# Patient Record
Sex: Male | Born: 1985 | Race: White | Hispanic: No | Marital: Single | Smoking: Current some day smoker
Health system: Southern US, Community
[De-identification: ages and names within clinical notes are randomized; demographics above are authoritative.]

## PROBLEM LIST (undated history)

## (undated) DIAGNOSIS — F32A Depression, unspecified: Secondary | ICD-10-CM

## (undated) DIAGNOSIS — F419 Anxiety disorder, unspecified: Secondary | ICD-10-CM

## (undated) HISTORY — PX: LUNG SURGERY: SHX703

## (undated) HISTORY — DX: Depression, unspecified: F32.A

## (undated) HISTORY — DX: Anxiety disorder, unspecified: F41.9

---

## 2013-03-30 ENCOUNTER — Emergency Department (HOSPITAL_COMMUNITY): Payer: BC Managed Care – PPO

## 2013-03-30 ENCOUNTER — Encounter (HOSPITAL_COMMUNITY): Payer: Self-pay | Admitting: *Deleted

## 2013-03-30 ENCOUNTER — Emergency Department (HOSPITAL_COMMUNITY)
Admission: EM | Admit: 2013-03-30 | Discharge: 2013-03-30 | Disposition: A | Discharge status: Home / Self Care | Payer: BC Managed Care – PPO | Attending: Emergency Medicine | Admitting: Emergency Medicine

## 2013-03-30 DIAGNOSIS — X500XXA Overexertion from strenuous movement or load, initial encounter: Secondary | ICD-10-CM | POA: Insufficient documentation

## 2013-03-30 DIAGNOSIS — Y9301 Activity, walking, marching and hiking: Secondary | ICD-10-CM | POA: Insufficient documentation

## 2013-03-30 DIAGNOSIS — S92309A Fracture of unspecified metatarsal bone(s), unspecified foot, initial encounter for closed fracture: Secondary | ICD-10-CM | POA: Insufficient documentation

## 2013-03-30 DIAGNOSIS — Y9289 Other specified places as the place of occurrence of the external cause: Secondary | ICD-10-CM | POA: Insufficient documentation

## 2013-03-30 DIAGNOSIS — S92302A Fracture of unspecified metatarsal bone(s), left foot, initial encounter for closed fracture: Secondary | ICD-10-CM

## 2013-03-30 DIAGNOSIS — F17200 Nicotine dependence, unspecified, uncomplicated: Secondary | ICD-10-CM | POA: Insufficient documentation

## 2013-03-30 DIAGNOSIS — W108XXA Fall (on) (from) other stairs and steps, initial encounter: Secondary | ICD-10-CM | POA: Insufficient documentation

## 2013-03-30 MED ORDER — HYDROCODONE-ACETAMINOPHEN 5-325 MG PO TABS: 1.0000 | ORAL_TABLET | Freq: Four times a day (QID) | ORAL | Status: DC | PRN

## 2013-03-30 MED ORDER — HYDROCODONE-ACETAMINOPHEN 5-325 MG PO TABS
1.0000 | ORAL_TABLET | Freq: Once | ORAL | Status: AC
  Administered 2013-03-30: 1 via ORAL
  Filled 2013-03-30: qty 1

## 2013-03-30 NOTE — ED Notes (Signed)
Pt reports walking down stairs 1 hour PTA and twisting his ankle.  Denies hitting head, denies LOC.  Swelling and bruising noted to (L) lateral ankle and foot.  Pulses, sensation and movement noted.

## 2013-03-30 NOTE — ED Provider Notes (Signed)
History     CSN: [id number]  Arrival date & time 03/30/13  1947   First MD Initiated Contact with Patient 03/30/13 2016      Chief Complaint  Patient presents with  . Foot Injury    (Consider location/radiation/quality/duration/timing/severity/associated sxs/prior treatment) HPI Patient presents to the emergency department with left foot injury that occurred just prior to arrival.  Patient, states he was walking down some stairs, when he twisted his ankle and fell.  Patient, states, that he is having left lateral foot pain.  Patient denies numbness or weakness of the foot.  Patient, states, that he did not take any medications prior to arrival. History reviewed. No pertinent past medical history.  Past Surgical History  Procedure Laterality Date  . Lung surgery      History reviewed. No pertinent family history.  History  Substance Use Topics  . Smoking status: Current Every Day Smoker -- 0.50 packs/day    Types: Cigarettes  . Smokeless tobacco: Not on file  . Alcohol Use: Yes     Comment: socially      Review of Systems All other systems negative except as documented in the HPI. All pertinent positives and negatives as reviewed in the HPI. Allergies  Aspirin  Home Medications   Current Outpatient Rx  Name  Route  Sig  Dispense  Refill  . ibuprofen (ADVIL,MOTRIN) 800 MG tablet   Oral   Take 800 mg by mouth every 8 (eight) hours as needed for pain.           BP 156/94  Pulse 77  Temp(Src) 98.2 F (36.8 C) (Oral)  Resp 22  SpO2 100%  Physical Exam  Nursing note and vitals reviewed. Constitutional: He appears well-developed and well-nourished.  HENT:  Head: Normocephalic and atraumatic.  Mouth/Throat: Oropharynx is clear and moist.  Eyes: Pupils are equal, round, and reactive to light.  Cardiovascular: Normal rate, regular rhythm and normal heart sounds.  Exam reveals no gallop and no friction rub.   No murmur heard. Pulmonary/Chest: Effort normal  and breath sounds normal. No respiratory distress.  Musculoskeletal:       Left foot: He exhibits decreased range of motion, tenderness and swelling. He exhibits normal capillary refill, no crepitus, no deformity and no laceration.       Feet:    ED Course  Procedures (including critical care time)  Labs Reviewed - No data to display Dg Ankle Complete Left  03/30/2013  *RADIOLOGY REPORT*  Clinical Data: Fall down stairs.  Ankle injury and pain.  LEFT ANKLE COMPLETE - 3+ VIEW  Comparison: None.  Findings: No evidence of fracture or dislocation.  No evidence of ankle joint effusion.  No other significant bone abnormality identified.  IMPRESSION: Negative.   Original Report Authenticated By: Myles Rosenthal, M.D.    Dg Foot Complete Left  03/30/2013  *RADIOLOGY REPORT*  Clinical Data: 27 year old male status post fall down stairs. Pain.  LEFT FOOT - COMPLETE 3+ VIEW  Comparison: None.  Findings: Comminuted minimally-displaced fracture at the base of the left fifth metatarsal.  Overlying soft tissue swelling.  Calcaneus intact. Bone mineralization is within normal limits. Joint spaces and alignment within normal limits.  No other acute fracture.  IMPRESSION: Comminuted minimally-displaced fracture at the base of the left fifth metatarsal.   Original Report Authenticated By: Erskine Speed, M.D.    Patient has a fracture at the base of the fifth metatarsal.  Patient be referred to orthopedics for further evaluation and follow  is advised to ice and elevate his foot.  Told to return here for any worsening in his condition     MDM  MDM Reviewed: nursing note and vitals Interpretation: x-ray           Carlyle Dolly, PA-C 03/30/13 2237

## 2013-04-02 NOTE — ED Provider Notes (Signed)
Medical screening examination/treatment/procedure(s) were performed by non-physician practitioner and as supervising physician I was immediately available for consultation/collaboration.   Malva Diesing, MD 04/02/13 1052 

## 2015-08-10 ENCOUNTER — Emergency Department (HOSPITAL_COMMUNITY)
Admission: EM | Admit: 2015-08-10 | Discharge: 2015-08-10 | Disposition: A | Discharge status: Home / Self Care | Payer: Self-pay | Attending: Emergency Medicine | Admitting: Emergency Medicine

## 2015-08-10 ENCOUNTER — Emergency Department (HOSPITAL_COMMUNITY): Payer: Self-pay

## 2015-08-10 ENCOUNTER — Encounter (HOSPITAL_COMMUNITY): Payer: Self-pay | Admitting: Emergency Medicine

## 2015-08-10 DIAGNOSIS — R0602 Shortness of breath: Secondary | ICD-10-CM | POA: Insufficient documentation

## 2015-08-10 DIAGNOSIS — R0789 Other chest pain: Secondary | ICD-10-CM | POA: Insufficient documentation

## 2015-08-10 DIAGNOSIS — S46912A Strain of unspecified muscle, fascia and tendon at shoulder and upper arm level, left arm, initial encounter: Secondary | ICD-10-CM | POA: Insufficient documentation

## 2015-08-10 DIAGNOSIS — M62838 Other muscle spasm: Secondary | ICD-10-CM | POA: Insufficient documentation

## 2015-08-10 DIAGNOSIS — Y9289 Other specified places as the place of occurrence of the external cause: Secondary | ICD-10-CM | POA: Insufficient documentation

## 2015-08-10 DIAGNOSIS — Y998 Other external cause status: Secondary | ICD-10-CM | POA: Insufficient documentation

## 2015-08-10 DIAGNOSIS — Y9389 Activity, other specified: Secondary | ICD-10-CM | POA: Insufficient documentation

## 2015-08-10 DIAGNOSIS — R071 Chest pain on breathing: Secondary | ICD-10-CM

## 2015-08-10 DIAGNOSIS — Z720 Tobacco use: Secondary | ICD-10-CM | POA: Insufficient documentation

## 2015-08-10 DIAGNOSIS — X58XXXA Exposure to other specified factors, initial encounter: Secondary | ICD-10-CM | POA: Insufficient documentation

## 2015-08-10 LAB — CBC WITH DIFFERENTIAL/PLATELET
BASOS PCT: 0 % (ref 0–1)
Basophils Absolute: 0 10*3/uL (ref 0.0–0.1)
EOS ABS: 0.2 10*3/uL (ref 0.0–0.7)
Eosinophils Relative: 2 % (ref 0–5)
HEMATOCRIT: 43.8 % (ref 39.0–52.0)
HEMOGLOBIN: 14.9 g/dL (ref 13.0–17.0)
LYMPHS ABS: 2.5 10*3/uL (ref 0.7–4.0)
Lymphocytes Relative: 39 % (ref 12–46)
MCH: 32.4 pg (ref 26.0–34.0)
MCHC: 34 g/dL (ref 30.0–36.0)
MCV: 95.2 fL (ref 78.0–100.0)
MONOS PCT: 7 % (ref 3–12)
Monocytes Absolute: 0.5 10*3/uL (ref 0.1–1.0)
NEUTROS ABS: 3.3 10*3/uL (ref 1.7–7.7)
NEUTROS PCT: 52 % (ref 43–77)
Platelets: 207 10*3/uL (ref 150–400)
RBC: 4.6 MIL/uL (ref 4.22–5.81)
RDW: 12.5 % (ref 11.5–15.5)
WBC: 6.5 10*3/uL (ref 4.0–10.5)

## 2015-08-10 LAB — I-STAT TROPONIN, ED: TROPONIN I, POC: 0 ng/mL (ref 0.00–0.08)

## 2015-08-10 LAB — BASIC METABOLIC PANEL
Anion gap: 7 (ref 5–15)
BUN: 10 mg/dL (ref 6–20)
CHLORIDE: 103 mmol/L (ref 101–111)
CO2: 28 mmol/L (ref 22–32)
CREATININE: 0.77 mg/dL (ref 0.61–1.24)
Calcium: 9.5 mg/dL (ref 8.9–10.3)
GFR calc Af Amer: >60 mL/min (ref >60)
GFR calc non Af Amer: >60 mL/min (ref >60)
Glucose, Bld: 104 mg/dL — ABNORMAL HIGH (ref 65–99)
POTASSIUM: 3.8 mmol/L (ref 3.5–5.1)
SODIUM: 138 mmol/L (ref 135–145)

## 2015-08-10 LAB — D-DIMER, QUANTITATIVE: D-Dimer, Quant: <0.27 ug/mL-FEU (ref 0.00–0.48)

## 2015-08-10 MED ORDER — HYDROCODONE-ACETAMINOPHEN 5-325 MG PO TABS: 1.0000 | ORAL_TABLET | Freq: Four times a day (QID) | ORAL | Status: DC | PRN

## 2015-08-10 MED ORDER — MORPHINE SULFATE (PF) 4 MG/ML IV SOLN
4.0000 mg | Freq: Once | INTRAVENOUS | Status: AC
  Administered 2015-08-10: 4 mg via INTRAVENOUS
  Filled 2015-08-10: qty 1

## 2015-08-10 MED ORDER — GI COCKTAIL ~~LOC~~
30.0000 mL | Freq: Once | ORAL | Status: AC
  Administered 2015-08-10: 30 mL via ORAL
  Filled 2015-08-10: qty 30

## 2015-08-10 MED ORDER — SODIUM CHLORIDE 0.9 % IV SOLN
Freq: Once | INTRAVENOUS | Status: AC
  Administered 2015-08-10: 10:00:00 via INTRAVENOUS

## 2015-08-10 MED ORDER — NITROGLYCERIN 0.4 MG SL SUBL: 0.4000 mg | SUBLINGUAL_TABLET | SUBLINGUAL | Status: DC | PRN

## 2015-08-10 NOTE — ED Provider Notes (Signed)
CSN: [id number]     Arrival date & time 08/10/15  4742 History   First MD Initiated Contact with Patient 08/10/15 [phone]     Chief Complaint  Patient presents with  . Chest Pain     (Consider location/radiation/quality/duration/timing/severity/associated sxs/prior Treatment) HPI Comments: Ronnie Hester is a 29 y.o. male with a PMHx of spontaneous pneumothorax s/p lung surgery (unable to find prior documentation, this is per pt's reports), who presents to the ED with complaints of gradual onset chest pain that began last night after he was washing his dog in the bathtub, and he stood up, at which time his left shoulder blade had some pain. He describes pain is 6/10 constant sharp pain radiating to the center of his chest from the underside of the left shoulder blade, worse with movement of his chest or neck and deep inspiration, and mildly improved with laying on his left side, unrelieved with Tylenol. He states he had some shortness of breath yesterday but this resolved today. He denies any diaphoresis, lightheadedness, fevers, chills, cough, hemoptysis, leg swelling, recent travel/surgery/immobilization, history of DVT/PE, family history of cardiac disease, abdominal pain, nausea, vomiting, diarrhea, constipation, numbness, tingling, weakness, claudication, orthopnea, smoking, or any other medical problems. Denies any illicit drug use. States this feels somewhat like when he had a spontaneous PTX. He required multiple chest tubes and ultimately surgical resection of a portion of lung.   Patient is a 29 y.o. male presenting with chest pain. The history is provided by the patient. No language interpreter was used.  Chest Pain Pain location:  Substernal area Pain quality: sharp   Pain radiates to:  L shoulder Pain radiates to the back: yes   Pain severity:  Moderate Onset quality:  Gradual Duration:  12 hours Timing:  Constant Progression:  Unchanged Chronicity:  Recurrent Context: movement     Relieved by:  Certain positions Worsened by:  Movement and deep breathing Ineffective treatments: tylenol. Associated symptoms: shortness of breath (now resolved, some yesterday)   Associated symptoms: no abdominal pain, no claudication, no cough, no diaphoresis, no fever, no lower extremity edema, no nausea, no near-syncope, no numbness, no orthopnea, no syncope, not vomiting and no weakness   Risk factors: male sex   Risk factors: no coronary artery disease, no diabetes mellitus, no high cholesterol, no hypertension, no immobilization, no prior DVT/PE, no smoking and no surgery     History reviewed. No pertinent past medical history. Past Surgical History  Procedure Laterality Date  . Lung surgery     History reviewed. No pertinent family history. Social History  Substance Use Topics  . Smoking status: Current Every Day Smoker -- 0.50 packs/day    Types: Cigarettes  . Smokeless tobacco: None  . Alcohol Use: Yes     Comment: socially    Review of Systems  Constitutional: Negative for fever, chills and diaphoresis.  Respiratory: Positive for shortness of breath (now resolved, some yesterday). Negative for cough and wheezing.   Cardiovascular: Positive for chest pain. Negative for orthopnea, claudication, leg swelling, syncope and near-syncope.  Gastrointestinal: Negative for nausea, vomiting, abdominal pain, diarrhea and constipation.  Genitourinary: Negative for dysuria and hematuria.  Musculoskeletal: Positive for myalgias (under L scapula). Negative for arthralgias.  Skin: Negative for color change.  Allergic/Immunologic: Negative for immunocompromised state.  Neurological: Negative for weakness, light-headedness and numbness.  Psychiatric/Behavioral: Negative for confusion.   10 Systems reviewed and are negative for acute change except as noted in the HPI.    Allergies  Aspirin  Home Medications   Prior to Admission medications   Medication Sig Start Date End Date  Taking? Authorizing Provider  HYDROcodone-acetaminophen (NORCO/VICODIN) 5-325 MG per tablet Take 1 tablet by mouth every 6 (six) hours as needed for pain. 03/30/13   Charlestine Night, PA-C  ibuprofen (ADVIL,MOTRIN) 800 MG tablet Take 800 mg by mouth every 8 (eight) hours as needed for pain.    Historical Provider, MD   BP 128/64 mmHg  Pulse 55  Temp(Src) 98.7 F (37.1 C) (Oral)  Resp 20  SpO2 100% Physical Exam  Constitutional: He is oriented to person, place, and time. Vital signs are normal. He appears well-developed and well-nourished.  Non-toxic appearance. No distress.  Afebrile, nontoxic, NAD  HENT:  Head: Normocephalic and atraumatic.  Mouth/Throat: Oropharynx is clear and moist and mucous membranes are normal.  Eyes: Conjunctivae and EOM are normal. Right eye exhibits no discharge. Left eye exhibits no discharge.  Neck: Normal range of motion. Neck supple.  Cardiovascular: Normal rate, regular rhythm, normal heart sounds and intact distal pulses.  Exam reveals no gallop and no friction rub.   No murmur heard. RRR, nl s1/s2, no m/r/g, distal pulses intact, no pedal edema   Pulmonary/Chest: Effort normal and breath sounds normal. No respiratory distress. He has no decreased breath sounds. He has no wheezes. He has no rhonchi. He has no rales. He exhibits no tenderness, no crepitus, no deformity and no retraction.  Faintly audible breath sounds in all fields secondary to pt not taking full deep inspirations, but overall CTAB in all lung fields, no w/r/r, no hypoxia or increased WOB, speaking in full sentences, SpO2 100% on RA  No chest wall TTP, crepitus, deformity, or retractions.  Abdominal: Soft. Normal appearance and bowel sounds are normal. He exhibits no distension. There is no tenderness. There is no rigidity, no rebound, no guarding, no CVA tenderness, no tenderness at McBurney's point and negative Murphy's sign.  Musculoskeletal: Normal range of motion.       Left shoulder:  He exhibits tenderness and spasm. He exhibits normal range of motion, no bony tenderness, no swelling, no effusion, no crepitus, no deformity, normal pulse and normal strength.       Arms: L shoulder with FROM intact, no bony TTP, mild muscular TTP and spasms palpated under scapula, no swelling/effusion, no crepitus/deformity, negative apley scratch, neg pain with resisted int/ext rotation, neg empty can test. Strength and sensation grossly intact in all extremities, distal pulses intact.  Neg homan's bilaterally, no pedal edema.  Neurological: He is alert and oriented to person, place, and time. He has normal strength. No sensory deficit.  Skin: Skin is warm, dry and intact. No rash noted.  Psychiatric: He has a normal mood and affect.  Nursing note and vitals reviewed.   ED Course  Procedures (including critical care time) Labs Review Labs Reviewed  BASIC METABOLIC PANEL - Abnormal; Notable for the following:    Glucose, Bld 104 (*)    All other components within normal limits  D-DIMER, QUANTITATIVE (NOT AT [facility])  CBC WITH DIFFERENTIAL/PLATELET  Rosezena Sensor, ED    Imaging Review Dg Chest 2 View  08/10/2015   CLINICAL DATA:  Shortness of breath  EXAM: CHEST  2 VIEW  COMPARISON:  None.  FINDINGS: The heart size and mediastinal contours are within normal limits. Both lungs are clear. The visualized skeletal structures are unremarkable.  IMPRESSION: No active cardiopulmonary disease.   Electronically Signed   By: Lucio Edward.D.  On: 08/10/2015 10:02   I have personally reviewed and evaluated these images and lab results as part of my medical decision-making.   EKG Interpretation   Date/Time:  Monday August 10 2015 09:20:36 EDT Ventricular Rate:  59 PR Interval:  167 QRS Duration: 83 QT Interval:  390 QTC Calculation: 386 R Axis:   89 Text Interpretation:  Sinus arrhythmia No prior EKG for comparison ST  depression in aVL benign early repoloarization no prior EKg for  comparison  Confirmed by LIU MD, DANA ([id number]) on 08/10/2015 9:26:07 AM      MDM   Final diagnoses:  Atypical chest pain  Inspiratory pain  Muscle spasm of left shoulder area  Muscle strain, shoulder region, left, initial encounter    29 y.o. male here with CP x1 day, some SOB yesterday but none today. Hx of spontaneous PTX requiring chest tubes/surgical repair. Began after washing dog and then stood up. Tenderness to underside of L scapula, no chest wall tenderness anteriorly. Pt c/o pain with deep inspiration. Difficult to hear breath sounds due to pt not taking deep breaths, but they are faintly present in all fields. No tachycardia or hypoxia. Will get labs and dimer. Will get EKG and CXR. Will give morphine and GI cocktail, unable to give ASA since pt is allergic. Will order NTG but hold until after morphine/GI cocktail. Will reassess shortly.   10:20 AM Trop neg. EKG with some ST depression in aVL and ST elevations which are likely early repol, no prior EKG for comparison. Given that we are >12hrs after onset, with neg trop, unlikely to be ACS related. BMP and CBC unremarkable. Dimer neg, unlikely to be PE/dissection. CXR clear with no PTX. Pt feeling improved after narcotic and GI cocktail, likely just muscle strain. Discussed heat/motrin and will send home with norco. Discussed f/up with [facility] in 1wk for recheck and to establish care in this area. I explained the diagnosis and have given explicit precautions to return to the ER including for any other new or worsening symptoms. The patient understands and accepts the medical plan as it's been dictated and I have answered their questions. Discharge instructions concerning home care and prescriptions have been given. The patient is STABLE and is discharged to home in good condition.  BP 128/85 mmHg  Pulse 60  Temp(Src) 98.7 F (37.1 C) (Oral)  Resp 13  SpO2 100%  Meds ordered this encounter  Medications  . morphine 4 MG/ML injection 4  mg    Sig:   . gi cocktail (Maalox,Lidocaine,Donnatal)    Sig:   . 0.9 %  sodium chloride infusion    Sig:   . HYDROcodone-acetaminophen (NORCO) 5-325 MG per tablet    Sig: Take 1 tablet by mouth every 6 (six) hours as needed for severe pain.    Dispense:  6 tablet    Refill:  0    Order Specific Question:  Supervising Provider    Answer:  Eber Hong [address] [place], PA-C 08/10/15 1036  Lavera Guise, MD 08/10/15 1757

## 2015-08-10 NOTE — Discharge Instructions (Signed)
Take ibuprofen as directed for inflammation and pain with norco for breakthrough pain. Do not drive or operate machinery with pain medication use. Use heat to areas of soreness, no more than 20 minutes at a time every hour. Follow up with [place] [facility] wellness for recheck of ongoing symptoms in the next 1-2 weeks. Return to ER for emergent changing or worsening of symptoms.     Chest Wall Pain Chest wall pain is pain felt in or around the chest bones and muscles. It may take up to 6 weeks to get better. It may take longer if you are active. Chest wall pain can happen on its own. Other times, things like germs, injury, coughing, or exercise can cause the pain. HOME CARE   Avoid activities that make you tired or cause pain. Try not to use your chest, belly (abdominal), or side muscles. Do not use heavy weights.  Put ice on the sore area.  Put ice in a plastic bag.  Place a towel between your skin and the bag.  Leave the ice on for 15-20 minutes for the first 2 days.  Only take medicine as told by your doctor. GET HELP RIGHT AWAY IF:   You have more pain or are very uncomfortable.  You have a fever.  Your chest pain gets worse.  You have new problems.  You feel sick to your stomach (nauseous) or throw up (vomit).  You start to sweat or feel lightheaded.  You have a cough with mucus (phlegm).  You cough up blood. MAKE SURE YOU:   Understand these instructions.  Will watch your condition.  Will get help right away if you are not doing well or get worse. Document Released: 05/23/2008 Document Revised: 02/27/2012 Document Reviewed: 08/01/2011 [facility] Patient Information 2015 [place], [place]. This information is not intended to replace advice given to you by your health care provider. Make sure you discuss any questions you have with your health care provider.  Muscle Strain A muscle strain (pulled muscle) happens when a muscle is stretched beyond normal length. It happens  when a sudden, violent force stretches your muscle too far. Usually, a few of the fibers in your muscle are torn. Muscle strain is common in athletes. Recovery usually takes 1-2 weeks. Complete healing takes 5-6 weeks.  HOME CARE   Follow the PRICE method of treatment to help your injury get better. Do this the first 2-3 days after the injury:  Protect. Protect the muscle to keep it from getting injured again.  Rest. Limit your activity and rest the injured body part.  Ice. Put ice in a plastic bag. Place a towel between your skin and the bag. Then, apply the ice and leave it on from 15-20 minutes each hour. After the third day, switch to moist heat packs.  Compression. Use a splint or elastic bandage on the injured area for comfort. Do not put it on too tightly.  Elevate. Keep the injured body part above the level of your heart.  Only take medicine as told by your doctor.  Warm up before doing exercise to prevent future muscle strains. GET HELP IF:   You have more pain or puffiness (swelling) in the injured area.  You feel numbness, tingling, or notice a loss of strength in the injured area. MAKE SURE YOU:   Understand these instructions.  Will watch your condition.  Will get help right away if you are not doing well or get worse. Document Released: 09/13/2008 Document Revised:  09/25/2013 Document Reviewed: 07/04/2013 [employer] Patient Information 2015 [place], [place]. This information is not intended to replace advice given to you by your health care provider. Make sure you discuss any questions you have with your health care provider.  Musculoskeletal Pain Musculoskeletal pain is muscle and boney aches and pains. These pains can occur in any part of the body. Your caregiver may treat you without knowing the cause of the pain. They may treat you if blood or urine tests, X-rays, and other tests were normal.  CAUSES There is often not a definite cause or reason for these pains.  These pains may be caused by a type of germ (virus). The discomfort may also come from overuse. Overuse includes working out too hard when your body is not fit. Boney aches also come from weather changes. Bone is sensitive to atmospheric pressure changes. HOME CARE INSTRUCTIONS   Ask when your test results will be ready. Make sure you get your test results.  Only take over-the-counter or prescription medicines for pain, discomfort, or fever as directed by your caregiver. If you were given medications for your condition, do not drive, operate machinery or power tools, or sign legal documents for 24 hours. Do not drink alcohol. Do not take sleeping pills or other medications that may interfere with treatment.  Continue all activities unless the activities cause more pain. When the pain lessens, slowly resume normal activities. Gradually increase the intensity and duration of the activities or exercise.  During periods of severe pain, bed rest may be helpful. Lay or sit in any position that is comfortable.  Putting ice on the injured area.  Put ice in a bag.  Place a towel between your skin and the bag.  Leave the ice on for 15 to 20 minutes, 3 to 4 times a day.  Follow up with your caregiver for continued problems and no reason can be found for the pain. If the pain becomes worse or does not go away, it may be necessary to repeat tests or do additional testing. Your caregiver may need to look further for a possible cause. SEEK IMMEDIATE MEDICAL CARE IF:  You have pain that is getting worse and is not relieved by medications.  You develop chest pain that is associated with shortness or breath, sweating, feeling sick to your stomach (nauseous), or throw up (vomit).  Your pain becomes localized to the abdomen.  You develop any new symptoms that seem different or that concern you. MAKE SURE YOU:   Understand these instructions.  Will watch your condition.  Will get help right away if you  are not doing well or get worse. Document Released: 12/05/2005 Document Revised: 02/27/2012 Document Reviewed: 08/09/2013 [facility] Patient Information 2015 [place], [place]. This information is not intended to replace advice given to you by your health care provider. Make sure you discuss any questions you have with your health care provider.  Muscle Cramps and Spasms Muscle cramps and spasms are when muscles tighten by themselves. They usually get better within minutes. Muscle cramps are painful. They are usually stronger and last longer than muscle spasms. Muscle spasms may or may not be painful. They can last a few seconds or much longer. HOME CARE  Drink enough fluid to keep your pee (urine) clear or pale yellow.  Massage, stretch, and relax the muscle.  Use a warm towel, heating pad, or warm shower water on tight muscles.  Place ice on the muscle if it is tender or in pain.  Put ice in a plastic bag.  Place a towel between your skin and the bag.  Leave the ice on for 15-20 minutes, 03-04 times a day.  Only take medicine as told by your doctor. GET HELP RIGHT AWAY IF:  Your cramps or spasms get worse, happen more often, or do not get better with time. MAKE SURE YOU:  Understand these instructions.  Will watch your condition.  Will get help right away if you are not doing well or get worse. Document Released: 11/17/2008 Document Revised: 04/01/2013 Document Reviewed: 11/21/2012 [facility] Patient Information 2015 [place], [place]. This information is not intended to replace advice given to you by your health care provider. Make sure you discuss any questions you have with your health care provider.  Heat Therapy Heat therapy can help ease sore, stiff, injured, and tight muscles and joints. Heat relaxes your muscles, which may help ease your pain.  RISKS AND COMPLICATIONS If you have any of the following conditions, do not use heat therapy unless your health care provider has  approved:  Poor circulation.  Healing wounds or scarred skin in the area being treated.  Diabetes, heart disease, or high blood pressure.  Not being able to feel (numbness) the area being treated.  Unusual swelling of the area being treated.  Active infections.  Blood clots.  Cancer.  Inability to communicate pain. This may include young children and people who have problems with their brain function (dementia).  Pregnancy. Heat therapy should only be used on old, pre-existing, or long-lasting (chronic) injuries. Do not use heat therapy on new injuries unless directed by your health care provider. HOW TO USE HEAT THERAPY There are several different kinds of heat therapy, including:  Moist heat pack.  Warm water bath.  Hot water bottle.  Electric heating pad.  Heated gel pack.  Heated wrap.  Electric heating pad. Use the heat therapy method suggested by your health care provider. Follow your health care provider's instructions on when and how to use heat therapy. GENERAL HEAT THERAPY RECOMMENDATIONS  Do not sleep while using heat therapy. Only use heat therapy while you are awake.  Your skin may turn pink while using heat therapy. Do not use heat therapy if your skin turns red.  Do not use heat therapy if you have new pain.  High heat or long exposure to heat can cause burns. Be careful when using heat therapy to avoid burning your skin.  Do not use heat therapy on areas of your skin that are already irritated, such as with a rash or sunburn. SEEK MEDICAL CARE IF:  You have blisters, redness, swelling, or numbness.  You have new pain.  Your pain is worse. MAKE SURE YOU:  Understand these instructions.  Will watch your condition.  Will get help right away if you are not doing well or get worse. Document Released: 02/27/2012 Document Revised: 04/21/2014 Document Reviewed: 01/28/2014 [facility] Patient Information 2015 [place], [place]. This information is  not intended to replace advice given to you by your health care provider. Make sure you discuss any questions you have with your health care provider.

## 2015-08-10 NOTE — ED Notes (Addendum)
Pt reports he feels like he has collapsed lung- hx of them R side and chest tubes. Was washing dog and hunched over for long time and pain on left side started setting in. States feels like it was with it last time; feels short of breath and cannot take full inspiration. RN unable to hear air movement on either side due to lack of ability to take full inspiration. Able to talk and looks stable.

## 2015-08-17 ENCOUNTER — Ambulatory Visit: Payer: Self-pay | Admitting: Family Medicine

## 2016-04-01 IMAGING — DX DG CHEST 2V
2 series · 2 of 2 positions shown · non-contrast
Comparison: None.

CLINICAL DATA: Shortness of breath

EXAM:
CHEST  2 VIEW

[chest lat]
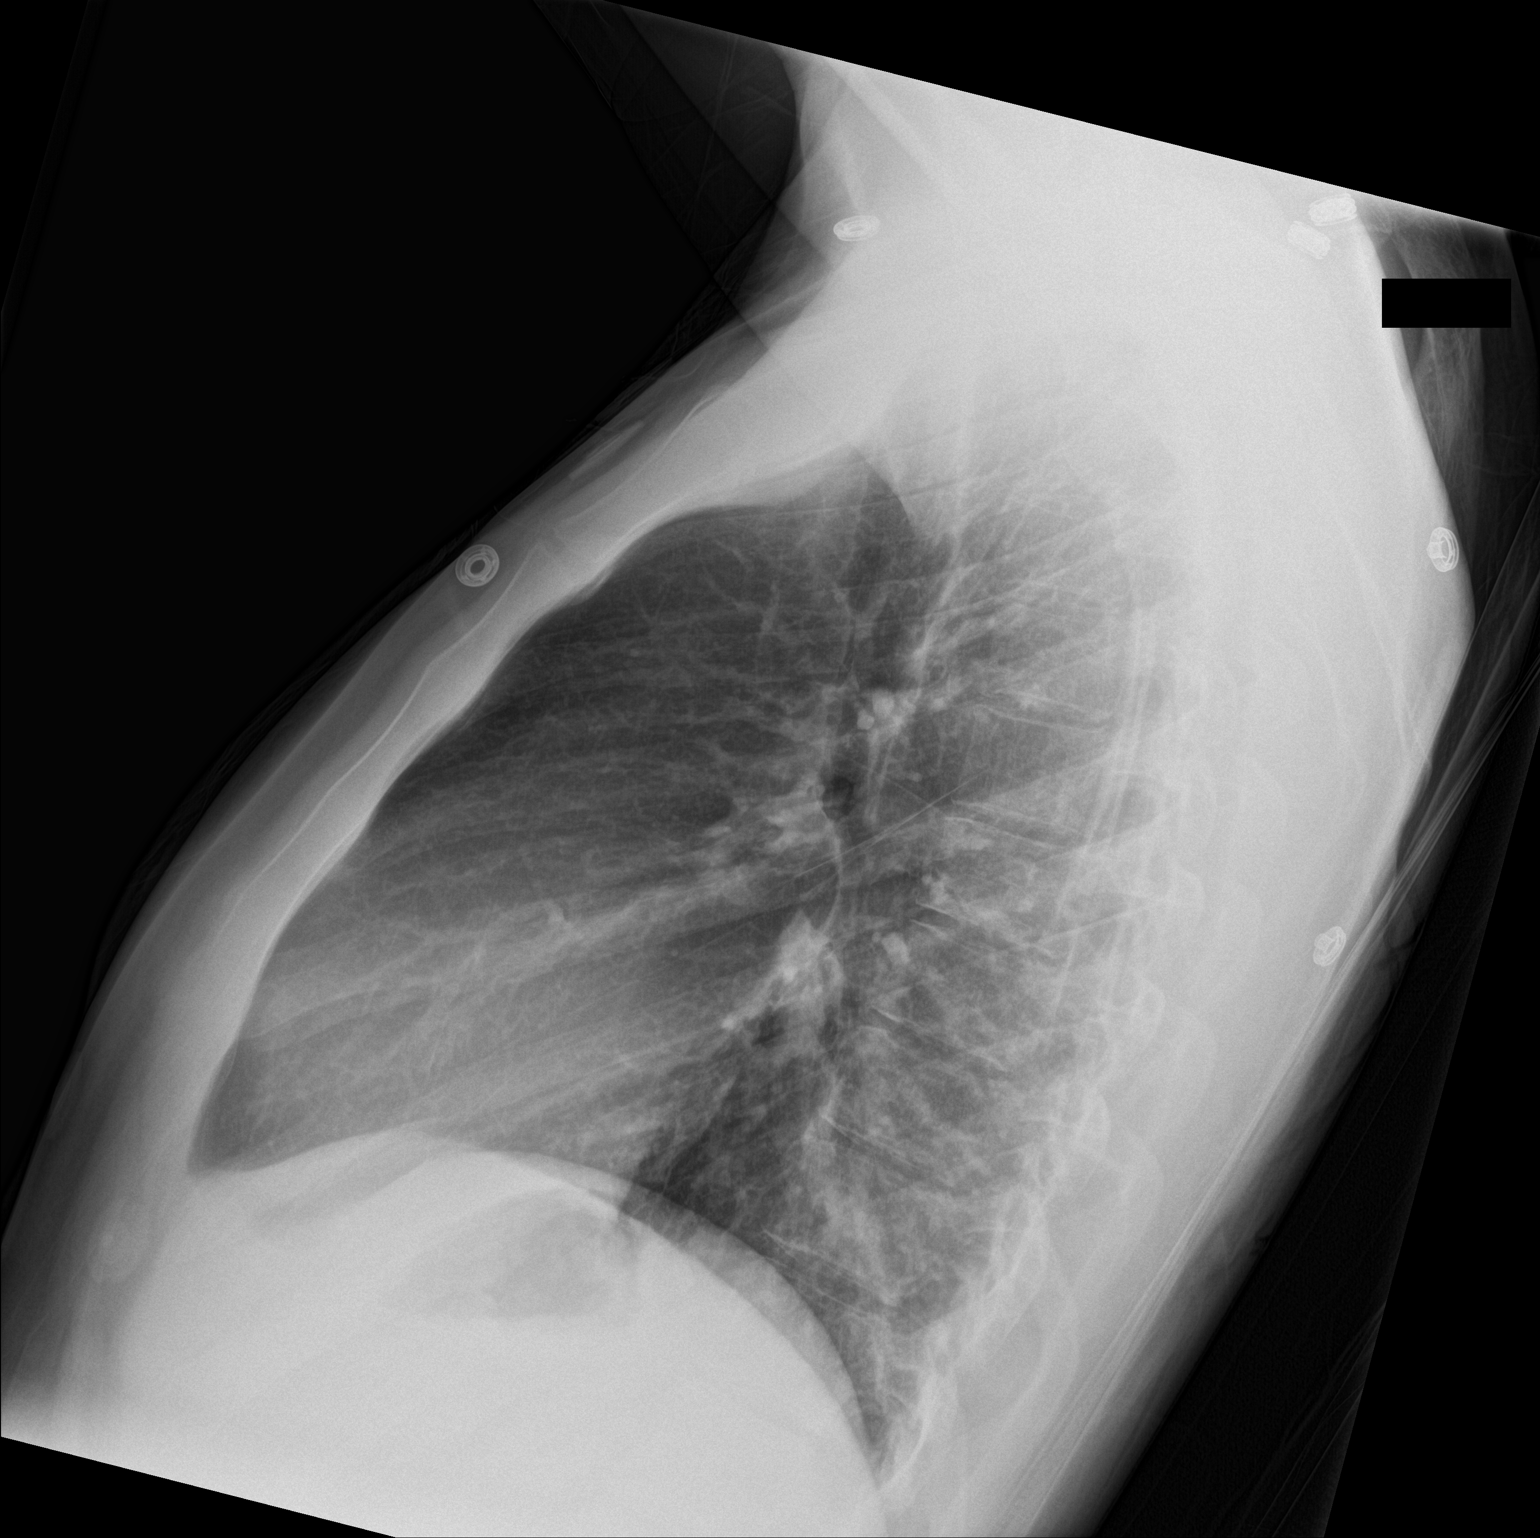

[chest ap]
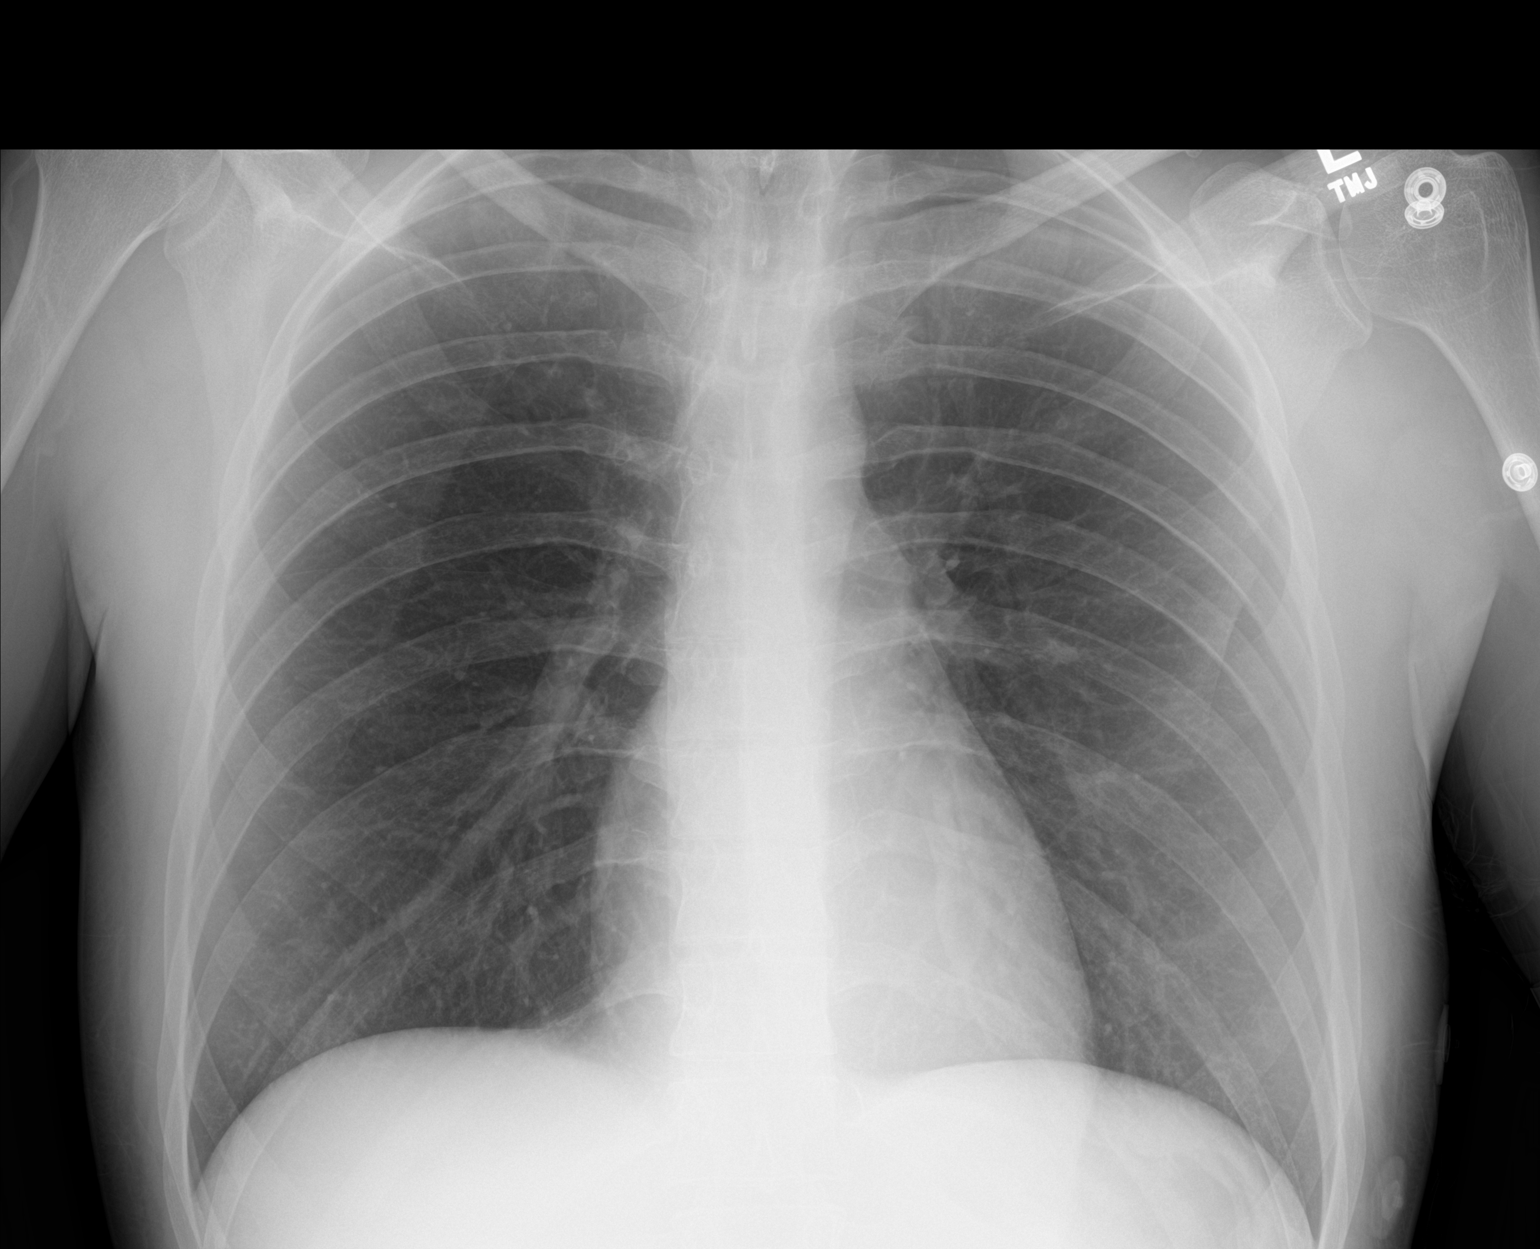

[2 of 2 positions shown; findings below may reference images not displayed]

FINDINGS: The heart size and mediastinal contours are within normal limits.
Both lungs are clear. The visualized skeletal structures are
unremarkable.
IMPRESSION: No active cardiopulmonary disease.

## 2019-09-27 ENCOUNTER — Ambulatory Visit (HOSPITAL_COMMUNITY)
Admission: EM | Admit: 2019-09-27 | Discharge: 2019-09-27 | Disposition: A | Discharge status: Home / Self Care | Payer: Self-pay

## 2019-09-27 ENCOUNTER — Other Ambulatory Visit: Payer: Self-pay

## 2019-09-27 ENCOUNTER — Encounter (HOSPITAL_COMMUNITY): Payer: Self-pay

## 2019-09-27 DIAGNOSIS — R21 Rash and other nonspecific skin eruption: Secondary | ICD-10-CM

## 2019-09-27 NOTE — ED Notes (Signed)
Patient verbalizes understanding of discharge instructions. Opportunity for questioning and answers were provided. Patient discharged from [facility] by Jenny Reichmann, [place].

## 2019-09-27 NOTE — ED Provider Notes (Addendum)
[facility]    CSN: [id number] Arrival date & time: 09/27/19  1531      History   Chief Complaint Chief Complaint  Patient presents with  . Tick Bite    HPI Ronnie Hester is a 33 y.o. male.   Patient here concerned with tick bite under chin x months.  He was seen at [facility] for same thing, provided bacitracin and triamcinolone, which he states "was working, but then I stopped for a day and it got worse and hasn't improved since".  He is here to have spot excised from his face.  Patient is upset because he states he called the clinic and he was told we could excise the spot, however, when he was brought back into the room he was then informed that we do not perform that kind of procedure here and he will need to follow up with dermatology.  He came to our clinic today specifically to have the lesion removed from under his chin.     History reviewed. No pertinent past medical history.  There are no active problems to display for this patient.   Past Surgical History:  Procedure Laterality Date  . LUNG SURGERY         Home Medications    Prior to Admission medications   Medication Sig Start Date End Date Taking? Authorizing Provider  HYDROcodone-acetaminophen (NORCO) 5-325 MG per tablet Take 1 tablet by mouth every 6 (six) hours as needed for severe pain. 08/10/15   Street, [place], PA-C  HYDROcodone-acetaminophen (NORCO/VICODIN) 5-325 MG per tablet Take 1 tablet by mouth every 6 (six) hours as needed for pain. Patient not taking: Reported on 08/10/2015 03/30/13   Charlestine Night, PA-C  ibuprofen (ADVIL,MOTRIN) 800 MG tablet Take 800 mg by mouth every 8 (eight) hours as needed for pain.    [provider]    Family History Family History  Family history unknown: Yes    Social History Social History   Tobacco Use  . Smoking status: Current Every Day Smoker    Packs/day: 0.50    Types: Cigarettes  Substance Use Topics  . Alcohol use: Yes     Comment: socially  . Drug use: No     Allergies   Aspirin and Tramadol   Review of Systems Review of Systems  Constitutional: Negative for chills, fatigue and fever.  Gastrointestinal: Negative for abdominal pain, nausea and vomiting.  Skin: Positive for rash. Negative for wound.       +itching  Psychiatric/Behavioral: Negative for sleep disturbance.     Physical Exam Triage Vital Signs ED Triage Vitals  Enc Vitals Group     BP 09/27/19 1601 126/74     Pulse Rate 09/27/19 1601 68     Resp 09/27/19 1601 16     Temp 09/27/19 1601 98.2 F (36.8 C)     Temp Source 09/27/19 1601 Temporal     SpO2 09/27/19 1601 99 %     Weight --      Height --      Head Circumference --      Peak Flow --      Pain Score 09/27/19 1610 0     Pain Loc --      Pain Edu? --      Excl. in GC? --    No data found.  Updated Vital Signs BP 126/74 (BP Location: Left Arm)   Pulse 68   Temp 98.2 F (36.8 C) (Temporal)   Resp 16  SpO2 99%   Visual Acuity Right Eye Distance:   Left Eye Distance:   Bilateral Distance:    Right Eye Near:   Left Eye Near:    Bilateral Near:     Physical Exam Vitals signs and nursing note reviewed.  Constitutional:      General: He is not in acute distress.    Appearance: Normal appearance. He is not ill-appearing.  HENT:     Head: Normocephalic and atraumatic.  Eyes:     General: No scleral icterus.    Extraocular Movements: Extraocular movements intact.     Conjunctiva/sclera: Conjunctivae normal.     Pupils: Pupils are equal, round, and reactive to light.  Neck:     Musculoskeletal: Normal range of motion and neck supple. No neck rigidity or muscular tenderness.  Pulmonary:     Effort: Pulmonary effort is normal. No respiratory distress.  Musculoskeletal: Normal range of motion.        General: No deformity.  Skin:    Capillary Refill: Capillary refill takes less than 2 seconds.     Findings: Lesion (2 cm nodule under chin, non-tender, no  erythema, no discharge, scan hair growth in lesion) present.  Neurological:     General: No focal deficit present.     Mental Status: He is alert and oriented to person, place, and time.  Psychiatric:        Attention and Perception: Attention normal.        Mood and Affect: Affect normal.        Behavior: Behavior is agitated. Behavior is cooperative.      UC Treatments / Results  Labs (all labs ordered are listed, but only abnormal results are displayed) Labs Reviewed - No data to display  EKG   Radiology No results found.  Procedures Procedures (including critical care time)  Medications Ordered in UC Medications - No data to display  Initial Impression / Assessment and Plan / UC Course  I have reviewed the triage vital signs and the nursing notes.  Pertinent labs & imaging results that were available during my care of the patient were reviewed by me and considered in my medical decision making (see chart for details).     Patient was advised we cannot excise the lesion here, and that he needs to go to dermatology. Patient became upset and requested to be refunded.  I advised him that he would need to contact billing, as I cannot authorize a refund. Final Clinical Impressions(s) / UC Diagnoses   Final diagnoses:  Rash and nonspecific skin eruption     Discharge Instructions     Follow up with dermatology. Use creams as directed.  Can also call [facility] at [phone] to schedule an appointment.    ED Prescriptions    None     PDMP not reviewed this encounter.   Peri Jefferson, PA-C 09/27/19 1656    Peri Jefferson, PA-C 09/27/19 1659

## 2019-09-27 NOTE — Discharge Instructions (Signed)
Follow up with dermatology. Use creams as directed.  Can also call [facility] at [phone] to schedule an appointment.

## 2019-09-27 NOTE — ED Triage Notes (Signed)
Pt presents with old tick bite on chin that now has severe intermittent itching that is relieved temporarily with steriods and creams.

## 2021-01-26 ENCOUNTER — Telehealth: Payer: Self-pay | Admitting: *Deleted

## 2021-01-26 DIAGNOSIS — Z20822 Contact with and (suspected) exposure to covid-19: Secondary | ICD-10-CM

## 2021-01-26 NOTE — Telephone Encounter (Signed)
RN notified by HR that pt left work this morning after being notified that roommate tested positive for Covid yesterday. Roommate began isolating when sx started Sunday 01/24/21. Pt's last exposure to him was 01/24/21.  Pt is fully vaxxed, however reported HA and body aches that started this morning at work.   Day 0 01/26/21 Day 5 01/31/21 RTW 02/01/21 with strict mask use thru Day 10 2/18.   Pt completed rapid testing after he left work today. This was negative. Advised pt too early to test since sx just started. Set up PCR testing for Day 3 01/29/21 at 10:30 at [facility].   Pt taking Tylenol, Nyquil at home. Advised do not mix tylenol and Nyquil due to double dosing. Use ibuprofen instead if needed. Pt verbalizes understanding.   Denies further questions/concerns.

## 2021-01-27 ENCOUNTER — Encounter: Payer: Self-pay | Admitting: *Deleted

## 2021-01-27 DIAGNOSIS — J939 Pneumothorax, unspecified: Secondary | ICD-10-CM | POA: Insufficient documentation

## 2021-01-27 NOTE — Telephone Encounter (Signed)
Reviewed RN Rolly Salter note patient notified of covid exposure and symptomatic 01/26/21.  Agree with plan of care.

## 2021-01-27 NOTE — Telephone Encounter (Signed)
Patient contacted by telephone stated yesterday chills and fever were worse than today alternated 500mg  tylenol and motrin OTC yesterday.  Today just had 2 doses of tylenol 500mg  po.  Appetite decreased and ate chicken today some nausea.   I have recommended clear fluids and bland diet.  Avoid dairy/spicy, fried and large portions of meat while having nausea.  If vomiting hold po intake x 1 hour.  Then sips clear fluids like broths, ginger ale, power ade, gatorade, pedialyte may advance to soft/bland if no vomiting x 24 hours and appetite returned otherwise hydration main focus.Contact clinic staff if symptoms worsen; I have alerted the patient to call if high fever, dehydration, marked weakness, fainting, increased abdominal pain, blood in stool or vomit (red or black).  Patient stated after taking with roommate his symptoms yesterday similar to roommate symptoms prior to positive covid test.  Discussed with patient fever can be dehydrating and ensure he tries to drink a cup of fluid noncaffeinated per hour when awake.  Does not need to force solid po intake when having nausea.  Discussed with patient typically if going to turn positive after exposure from omicron variant research has shown occurs by day 6 after exposure.  Most accurate testing day 3-5 of symptoms to avoid false negative.   Patient verbalized agreement and understanding of treatment plan and had no further questions at this time.

## 2021-01-29 ENCOUNTER — Other Ambulatory Visit: Payer: Self-pay

## 2021-01-29 DIAGNOSIS — Z20822 Contact with and (suspected) exposure to covid-19: Secondary | ICD-10-CM

## 2021-01-30 LAB — NOVEL CORONAVIRUS, NAA: SARS-CoV-2, NAA: DETECTED — AB

## 2021-01-30 LAB — SARS-COV-2, NAA 2 DAY TAT

## 2021-02-01 NOTE — Telephone Encounter (Signed)
Pt in to clinic now. Did RTW today as expected. Was aware of positive test result. Also knows strict mask use required thru 2/18. Denies any current sx, questions or concerns. Closing encounter.

## 2021-02-01 NOTE — Telephone Encounter (Signed)
Called pt to discuss positive result, current sx, and RTW. No answer. LVMRCB.

## 2021-02-03 NOTE — Telephone Encounter (Signed)
Reviewed RN Rolly Salter note patient notified positive covid test and strict mask use through 02/05/21

## 2021-04-13 ENCOUNTER — Telehealth: Payer: Self-pay | Admitting: *Deleted

## 2021-04-13 ENCOUNTER — Encounter: Payer: Self-pay | Admitting: *Deleted

## 2021-04-13 NOTE — Telephone Encounter (Signed)
RN spoke with pt yesterday 4/26 at supervisors' request. Pt reported a slightly itchy throat on Sunday that on Monday was more scratchy and sore. Pt reported Hx of seasonal allergies which he was not routinely treating. Had been taking Zyrtec intermittently over past few months only. Pt also was Covid positive 01/29/21. So per current CDC guidelines, pt did not need to quarantine or test due to sx. Advised pt of the same and reviewed home management of allergy sx with consistent Zyrtec use, Flonase, nasal saline. Pt verbalized understanding and agreement at that time.  Supervisor forwarded voicemail from pt to RN this morning. Pt reported in voicemail that throat was more sore today and he didn't feel comfortable reporting to work without going for Covid testing so he was planning on setting that up today and asked for supervisor to have RN contact pt.   RN spoke with pt watery eyes, rhinorrhea and PND. Is "not a fan of nasal sprays" and has only continued his Zyrtec. Has a rapid test scheduled for today at 1245. Gave pt RN phone extension to call back with results so clearance could be given to RTW.

## 2021-04-13 NOTE — Telephone Encounter (Signed)
Reviewed RN Haley note agreed with plan of care. 

## 2021-04-15 NOTE — Telephone Encounter (Signed)
Confirmed pt did RTW yesterday 4/27 as expected. Closing encounter.

## 2021-04-15 NOTE — Telephone Encounter (Signed)
Late entry: Pt called RN back on Tuesday afternoon and reported negative rapid test results. Cleared pt to RTW Wednesday 4/27 but to speak with supervisor if he wanted to take additional time off due to sx as he was not comfortable working with active sx. Will f/u Thursday.

## 2021-04-15 NOTE — Telephone Encounter (Signed)
Reviewed RN Rolly Salter notes; covid test negative and patient has returned to work onsite.

## 2021-05-25 ENCOUNTER — Telehealth: Payer: Self-pay | Admitting: *Deleted

## 2021-05-25 ENCOUNTER — Encounter: Payer: Self-pay | Admitting: *Deleted

## 2021-05-25 DIAGNOSIS — J00 Acute nasopharyngitis [common cold]: Secondary | ICD-10-CM

## 2021-05-25 DIAGNOSIS — R519 Headache, unspecified: Secondary | ICD-10-CM

## 2021-05-25 NOTE — Telephone Encounter (Signed)
Pt in to clinic c/o headache. Reports pressure behind eyes and in forehead, then straight thru back of his head. Reports mild runny nose that he attributes to allergies. Sts HA started last night. He took Tylenol and went to sleep. HA continued this morning. Reports the duration and intensity of HA are not typical of his HAs.    Day 0 6/6 Day 5 6/11 RTW 6/12 or next scheduled workday of 6/13 with strict mask use thru 6/16.   Pt scheduled for testing Day 3, 6/9 at [facility] [address].  Has not taken any meds today. Provided Tylenol and Ibuprofen from clinic OTC stock. 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

## 2021-05-25 NOTE — Telephone Encounter (Signed)
Reviewed RN Haley note agreed with plan of care. 

## 2021-05-27 NOTE — Telephone Encounter (Signed)
Noted RN Rolly Salter unable to reach patient via telephone today will try again tomorrow.

## 2021-05-27 NOTE — Telephone Encounter (Signed)
Attempted to contact pt to check on sx and ensure testing was completed today. No answer. LVMRCB.

## 2021-05-28 NOTE — Telephone Encounter (Signed)
Spoke with pt by phone. HA resolved 6/8. Still awaiting results from testing yesterday. Denies any needs or concerns. Weekend follow up.

## 2021-05-28 NOTE — Telephone Encounter (Signed)
Reviewed RN Rolly Salter note; symptoms resolved but covid test results still pending.  Will follow up with patient this weekend.

## 2021-05-29 NOTE — Telephone Encounter (Signed)
Patient contacted via telephone covid test results negative still having allergy symptoms but headache resolved.  Patient does not like taking allergy medication.  Discussed nasal saline available in clinic he can pick up bottle 2 sprays each nostril q2h wa prn congestion/rhinitis.  Washes out dust and pollen from nose/sinuses.  Flonase nasal 1 spray each nostril BID also typically nondrowsy can buy OTC.  Patient cleared to return to work on Monday 05/31/21.  HR notified.  Patient A&Ox3 spoke full sentences without difficulty, no cough/congestion/throat clearing/nasal sniffing noted during 2 minute telephone call

## 2021-05-31 NOTE — Telephone Encounter (Signed)
Reviewed RN Rolly Salter note and agreed with plan of care; patient returned to work as expected with strict mask use.

## 2021-05-31 NOTE — Telephone Encounter (Signed)
Supervisor confirmed pt did RTW today 6/13 as expected. Strict mask use thru 6/16.

## 2021-06-10 NOTE — Telephone Encounter (Signed)
Telephone message left for patient follow up to see if allergy symptoms resolving/controlled with recommendations

## 2021-07-04 NOTE — Telephone Encounter (Signed)
Spoke with patient via telephone stated still having allergy symptoms and he does not want to take any antihistamines on regular basis or nose sprays.  He has been dealing with these symptoms his whole life doesn't have sinus infection or feel like there is any problem with current symptoms.  Does not have any further questions or concerns or new symptoms.  Patient A&Ox3 spoke full sentences without difficulty no cough/nasal sniffing or throat clearing noted during 2 minute telephone call.

## 2022-10-24 ENCOUNTER — Telehealth: Payer: Self-pay | Admitting: Registered Nurse

## 2022-10-24 ENCOUNTER — Encounter: Payer: Self-pay | Admitting: Registered Nurse

## 2022-10-24 NOTE — Telephone Encounter (Signed)
Patient was seen by provider given Rx for eye medication and work note x 48 hours today and tomorrow.

## 2023-01-02 ENCOUNTER — Encounter: Payer: Self-pay | Admitting: Registered Nurse

## 2023-01-02 ENCOUNTER — Ambulatory Visit: Payer: Self-pay | Admitting: Occupational Medicine

## 2023-01-02 ENCOUNTER — Telehealth: Payer: Self-pay | Admitting: Registered Nurse

## 2023-01-02 VITALS — Temp 99.5°F

## 2023-01-02 DIAGNOSIS — S61452A Open bite of left hand, initial encounter: Secondary | ICD-10-CM

## 2023-01-02 MED ORDER — ACETAMINOPHEN 500 MG PO TABS: 1000.0000 mg | ORAL_TABLET | Freq: Four times a day (QID) | ORAL | 0 refills | Status: AC | PRN

## 2023-01-02 MED ORDER — TRIPLE ANTIBIOTIC 5-400-5000 EX OINT: TOPICAL_OINTMENT | Freq: Two times a day (BID) | CUTANEOUS | 0 refills | Status: DC

## 2023-01-02 MED ORDER — AMOXICILLIN-POT CLAVULANATE 875-125 MG PO TABS: 1.0000 | ORAL_TABLET | Freq: Two times a day (BID) | ORAL | 0 refills | Status: AC

## 2023-01-02 NOTE — Telephone Encounter (Signed)
Contacted by RN Evlyn Kanner via telephone after patient presented to clinic for dog bite left hand occurred 12/31/22. Swelling, redness and some tingling in hands noted by [employer]  Capillary refill less than 2 seconds.  Painful to make fist.   Dog known to patient up to date on shots (neighbors).  Epic does not list last tetanus vaccination for patient.  Discussed with patient and RN Evlyn Kanner recommend in the future same day start of antibiotics see teledoc/PCM/UC.  Augmentin 875mg  po BID x 10 days #20 RF0 electronic rx sent to his pharmacy of choice.  Exitcare handout on animal bite to be printed by [employer] and given to patient.  Wash with soap and water daily.  See me tomorrow  for re-evaluation in clinic schedule appt.  If red streaks past wrist need to go to ER for re-evaluation as IV antibiotics may be needed or if fingers/hand blue.  Discussed if work restrictions needed will require appt with teledoc/PCM as unable to write restrictions in this clinic due to contract limitations.  Verify with patient if tetanus in previous 10 years/check [facility] and if not booster today also.  RN and patient verbalized understanding information/instructions, agreed with plan of care and had no further questions at this time.

## 2023-01-02 NOTE — Progress Notes (Signed)
Patient reports Dog bite on Saturday. It was neighboring dog that is up to date on vaccines. Reports Swelling pain  and change in sensation to left hand. Noted small nicks / punctures from dog bite on fingers bilateral no signs of infection. Left hand on top of hand two bites / punctures with redness and warmth extremely pain to touch and significant swelling compared to other hand. Noted Good pulses and cap refill to both hand and fingers. Appears to be bluish discoloration to left ringer finger and pinky finger around the knuckle could be bruising. Both hand are cold which is normal for the patient. Patient reports can't clench fist all the way and is painful. Also middle finger had numbness for 1 1/2 day after injury now is pins needle feeling. Asked why didn't use Tele doc said not setup. Spoke to [employer] [facility] she order ABT's and scheduled to see tomorrow. Got Teldoc setup in clinic today explained would need to see PCP or [facility] for work restrictions. Wounds  cleaned with antiseptic applied neosporin and Band-Aids to Fingers. Hand Telfa and elastic co form wrap. Given supplies to change dressings neosporin antiseptic spray ibuprofen and tylenol for pain. Educated to take dressing off for while at night to get air.

## 2023-01-03 ENCOUNTER — Encounter: Payer: Self-pay | Admitting: Registered Nurse

## 2023-01-03 ENCOUNTER — Ambulatory Visit: Payer: Self-pay | Admitting: Registered Nurse

## 2023-01-03 VITALS — BP 124/73 | HR 72 | Temp 98.2°F

## 2023-01-03 DIAGNOSIS — W540XXD Bitten by dog, subsequent encounter: Secondary | ICD-10-CM

## 2023-01-03 NOTE — Patient Instructions (Signed)
Cellulitis, Adult  Cellulitis is a skin infection. The infected area is usually warm, red, swollen, and tender. This condition occurs most often in the arms and lower legs. The infection can travel to the muscles, blood, and underlying tissue and become serious. It is very important to get treated for this condition. What are the causes? Cellulitis is caused by bacteria. The bacteria enter through a break in the skin, such as a cut, burn, insect bite, open sore, or crack. What increases the risk? This condition is more likely to occur in people who: Have a weak body defense system (immune system). Have open wounds on the skin, such as cuts, burns, bites, and scrapes. Bacteria can enter the body through these open wounds. Are older than 35 years of age. Have diabetes. Have a type of long-lasting (chronic) liver disease (cirrhosis) or kidney disease. Are obese. Have a skin condition such as: Itchy rash (eczema). Slow movement of blood in the veins (venous stasis). Fluid buildup below the skin (edema). Have had radiation therapy. Use IV drugs. What are the signs or symptoms? Symptoms of this condition include: Redness, streaking, or spotting on the skin. Swollen area of the skin. Tenderness or pain when an area of the skin is touched. Warm skin. A fever. Chills. Blisters. How is this diagnosed? This condition is diagnosed based on a medical history and physical exam. You may also have tests, including: Blood tests. Imaging tests. How is this treated? Treatment for this condition may include: Medicines, such as antibiotic medicines or medicines to treat allergies (antihistamines). Supportive care, such as rest and application of cold or warm cloths (compresses) to the skin. Hospital care, if the condition is severe. The infection usually starts to get better within 1-2 days of treatment. Follow these instructions at home:  Medicines Take over-the-counter and prescription  medicines only as told by your health care provider. If you were prescribed an antibiotic medicine, take it as told by your health care provider. Do not stop taking the antibiotic even if you start to feel better. General instructions Drink enough fluid to keep your urine pale yellow. Do not touch or rub the infected area. Raise (elevate) the infected area above the level of your heart while you are sitting or lying down. Apply warm or cold compresses to the affected area as told by your health care provider. Keep all follow-up visits as told by your health care provider. This is important. These visits let your health care provider make sure a more serious infection is not developing. Contact a health care provider if: You have a fever. Your symptoms do not begin to improve within 1-2 days of starting treatment. Your bone or joint underneath the infected area becomes painful after the skin has healed. Your infection returns in the same area or another area. You notice a swollen bump in the infected area. You develop new symptoms. You have a general ill feeling (malaise) with muscle aches and pains. Get help right away if: Your symptoms get worse. You feel very sleepy. You develop vomiting or diarrhea that persists. You notice red streaks coming from the infected area. Your red area gets larger or turns dark in color. These symptoms may represent a serious problem that is an emergency. Do not wait to see if the symptoms will go away. Get medical help right away. Call your local emergency services (911 in the [place] Do not drive yourself to the hospital. Summary Cellulitis is a skin infection. This condition occurs most   often in the arms and lower legs. Treatment for this condition may include medicines, such as antibiotic medicines or antihistamines. Take over-the-counter and prescription medicines only as told by your health care provider. If you were prescribed an antibiotic medicine, do  not stop taking the antibiotic even if you start to feel better. Contact a health care provider if your symptoms do not begin to improve within 1-2 days of starting treatment or your symptoms get worse. Keep all follow-up visits as told by your health care provider. This is important. These visits let your health care provider make sure that a more serious infection is not developing. This information is not intended to replace advice given to you by your health care provider. Make sure you discuss any questions you have with your health care provider. Document Revised: 09/15/2021 Document Reviewed: 09/16/2021 Elsevier Patient Education  [place], Adult Animal bites range from mild to serious. An animal bite can result in any of these injuries: A scratch. A deep, open cut. Broken (punctured) or torn skin. A crush injury. A bone injury. A small bite from a house pet is usually less serious than a bite from a stray or wild animal. Cat bites can be more serious because their long, thin teeth can cause deep puncture wounds that close fast, trapping bacteria inside. Stray or wild animals, such as a raccoon, fox, skunk, or bat, are at higher risk of carrying a serious infection called rabies, which they can pass to a human through a bite. A bite from one of these animals needs medical care right away and, sometimes, rabies vaccination. What increases the risk? You are more likely to be bitten by an animal if: You are around unfamiliar pets. You disturb an animal when it is eating, sleeping, or caring for its babies. You are outdoors in a place where small, wild animals roam freely. What are the signs or symptoms? Common symptoms of an animal bite include: Pain. Bleeding. Swelling. Bruising. How is this diagnosed? This condition may be diagnosed based on a physical exam and medical history. Your health care provider will examine your wound and ask for details about the  animal and how the bite happened. You may also have tests, such as: Blood tests to check for infection. X-rays to check for damage to bones or joints. Taking a fluid sample from your wound and checking it for infection (culture test). How is this treated? Treatment depends on the type of animal, where the bite is on your body, and your medical history. Treatment may include: Wound care. This often includes cleaning the wound and rinsing it out (flushing it) with saline solution, which is made of salt and water. A bandage (dressing) is also often applied. In rare cases, the wound may be closed with stitches (sutures), staples, skin glue, or adhesive strips. Antibiotic medicine to prevent or treat infection. This medicine may be prescribed in pill or ointment form. If the bite area gets infected, the medicine may be given through an IV. A tetanus shot to prevent tetanus infection. Rabies treatment to prevent rabies infection, if the animal could have rabies. Surgery. This may be done if a bite gets infected or causes damage that needs to be repaired. Follow these instructions at home: Medicines Take or apply over-the-counter and prescription medicines only as told by your health care provider. If you were prescribed an antibiotic medicine, take or apply it as told by your health care provider. Do not stop using  the antibiotic even if you start to feel better. Wound care  Follow instructions from your health care provider about how to take care of your wound. Make sure you: Wash your hands with soap and water for at least 20 seconds before and after you change your dressing. If soap and water are not available, use hand sanitizer. Change your dressing as told by your health care provider. Leave sutures, skin glue, or adhesive strips in place. These skin closures may need to stay in place for 2 weeks or longer. If adhesive strip edges start to loosen and curl up, you may trim the loose edges. Do not  remove adhesive strips completely unless your health care provider tells you to do that. Check your wound every day for signs of infection. Check for: More redness, swelling, or pain. More fluid or blood. Warmth. Pus or a bad smell. General instructions  Raise (elevate) the injured area above the level of your heart while you are sitting or lying down, if this is possible. If directed, put ice on the injured area. To do this: Put ice in a plastic bag. Place a towel between your skin and the bag. Leave the ice on for 20 minutes, 2-3 times per day. Remove the ice if your skin turns bright red. This is very important. If you cannot feel pain, heat, or cold, you have a greater risk of damage to the area. Keep all follow-up visits. This is important. Contact a health care provider if: You have more redness, swelling, or pain around your wound. Your wound feels warm to the touch. You have a fever or chills. You have a general feeling of sickness (malaise). You feel nauseous or you vomit. You have pain that does not get better. Get help right away if: You have a red streak that leads away from your wound. You have non-clear fluid or more blood coming from your wound. There is pus or a bad smell coming from your wound. You have trouble moving your injured area. You have numbness or tingling that spreads beyond your wound. Summary Animal bites can range from mild to serious. An animal bite can cause a scratch on the skin, a deep and open cut, torn or punctured skin, a crush injury, or a bone injury. A bite from a stray or wild animal needs medical care right away and, sometimes, rabies vaccination. Your health care provider will examine your wound and ask for details about the animal and how the bite happened. Treatment may include wound care, antibiotic medicine, a tetanus shot, and rabies treatment if the animal could have rabies. This information is not intended to replace advice given to  you by your health care provider. Make sure you discuss any questions you have with your health care provider. Document Revised: 12/10/2021 Document Reviewed: 12/10/2021 Elsevier Patient Education  [place].

## 2023-01-03 NOTE — Telephone Encounter (Signed)
Patient seen in clinic 01/03/23 improving with 24 hours augmentin

## 2023-01-03 NOTE — Progress Notes (Signed)
Subjective:    Patient ID: Ronnie Hester, male    DOB: [birth year], 37 y.o.   MRN: [id number]  36y/o caucasian male established patient here for re-evaluation bilateral hands s/p dog bite this weekend.  Started augmentin 875mg  po BID yesterday.  Improved range of motion today e.g. making fist, pain greatly diminished along with redness and swelling.  Feeling much better today  Keeping wounds covered at work and wearing gloves.  Denied fever/chills/n/v/d.  Some numbness over central hand yesterday resolved overnight.      Review of Systems  Constitutional:  Negative for chills, diaphoresis, fatigue and fever.  HENT:  Negative for trouble swallowing and voice change.   Respiratory:  Negative for cough, shortness of breath and stridor.   Gastrointestinal:  Negative for diarrhea, nausea and vomiting.  Genitourinary:  Negative for difficulty urinating.  Musculoskeletal:  Positive for arthralgias, joint swelling and myalgias. Negative for gait problem.  Skin:  Positive for color change, rash and wound.  Neurological:  Positive for numbness. Negative for dizziness, tremors, weakness and headaches.  Psychiatric/Behavioral:  Negative for agitation, confusion and sleep disturbance.        Objective:   Physical Exam Vitals and nursing note reviewed.  Constitutional:      General: He is awake. He is not in acute distress.    Appearance: Normal appearance. He is well-developed, well-groomed and normal weight. He is not ill-appearing, toxic-appearing or diaphoretic.  HENT:     Head: Normocephalic and atraumatic.     Jaw: There is normal jaw occlusion.     Salivary Glands: Right salivary gland is not diffusely enlarged. Left salivary gland is not diffusely enlarged.     Right Ear: Hearing and external ear normal.     Left Ear: Hearing and external ear normal.     Nose: Nose normal. No congestion or rhinorrhea.     Mouth/Throat:     Lips: Pink. No lesions.     Mouth: Mucous membranes are  moist.     Pharynx: Oropharynx is clear.  Eyes:     General: Lids are normal. Vision grossly intact. Gaze aligned appropriately. No scleral icterus.       Right eye: No discharge.        Left eye: No discharge.     Extraocular Movements: Extraocular movements intact.     Conjunctiva/sclera: Conjunctivae normal.     Pupils: Pupils are equal, round, and reactive to light.  Neck:     Trachea: Trachea normal.  Cardiovascular:     Rate and Rhythm: Normal rate and regular rhythm.     Pulses: Normal pulses.          Radial pulses are 2+ on the right side and 2+ on the left side.  Pulmonary:     Effort: Pulmonary effort is normal. No respiratory distress.     Breath sounds: Normal breath sounds and air entry. No stridor or transmitted upper airway sounds. No wheezing.     Comments: Spoke full sentences without difficulty; no cough observed in exam room Abdominal:     General: Abdomen is flat.  Musculoskeletal:        General: Swelling, tenderness and signs of injury present.     Right hand: Swelling, laceration and tenderness present. No bony tenderness. Normal range of motion. Normal strength. Normal capillary refill.     Left hand: Swelling, laceration and tenderness present. No bony tenderness. Decreased range of motion. Normal strength. Normal capillary refill.  Arms:     Cervical back: Normal range of motion and neck supple. No rigidity.     Comments: Left greater than right injury puncture wounds healing/ecchymosis; swelling/pain/erythema decreased from yesterday able to make closed fist today tight; capillary refill less than 2 seconds bilaterally; skin warm dry and pink bilateral hands no increased temperature adjacent to scabbed or bruised areas  Lymphadenopathy:     Head:     Right side of head: No submandibular or preauricular adenopathy.     Left side of head: No submandibular or preauricular adenopathy.     Cervical: No cervical adenopathy.     Right cervical: No  superficial cervical adenopathy.    Left cervical: No superficial cervical adenopathy.  Skin:    General: Skin is warm and dry.     Capillary Refill: Capillary refill takes less than 2 seconds.     Coloration: Skin is not ashen, cyanotic, jaundiced, mottled, pale or sallow.     Findings: Abrasion, bruising, ecchymosis, erythema, signs of injury, laceration, rash and wound present. No abscess, acne, burn or petechiae. Rash is macular and papular. Rash is not crusting, nodular, purpuric, pustular, scaling, urticarial or vesicular.     Nails: There is no clubbing.          Comments: Healing puncture wounds bilateral hands palmar and dorsum scabbed clean and dry wound edges well approximated trace nonpitting edema and erythema brown scabs; ecchymosis dorsum left hand 2cm diameter centrally yellow green mildly TTP; abrasion palmar left hand and ecchymosis less than 1cm diameter  Neurological:     General: No focal deficit present.     Mental Status: He is alert and oriented to person, place, and time. Mental status is at baseline.     GCS: GCS eye subscore is 4. GCS verbal subscore is 5. GCS motor subscore is 6.     Cranial Nerves: Cranial nerves 2-12 are intact. No cranial nerve deficit, dysarthria or facial asymmetry.     Motor: Motor function is intact. No weakness, tremor, atrophy, abnormal muscle tone or seizure activity.     Coordination: Coordination is intact. Coordination normal.     Gait: Gait is intact. Gait normal.     Comments: In/out of chair without difficulty; gait sure and steady in clinic; bilateral hand grasp equal 5/5  Psychiatric:        Attention and Perception: Attention and perception normal.        Mood and Affect: Mood and affect normal.        Speech: Speech normal.        Behavior: Behavior normal. Behavior is cooperative.        Thought Content: Thought content normal.        Cognition and Memory: Cognition and memory normal.        Judgment: Judgment normal.       RN Kimrey removed all dressings and cleansed wounds with first aid spray prior to NP evaluation and reapplied triple antibiotic and dressings after evaluation e.g. bandaids.     Assessment & Plan:  A-dog bite subsequent encounter bilateral hands  P-Continue augmentin 875mg  po BID x 7 days.  Continue washing with soap and water daily.  Wear gloves at work to protect wounds/keep covered with bandaids.  Cryothreapy 15 minutes QID prn pain/swelling.  Discussed tingling may occur adjacent to puncture wounds as healing.  Continue triple antibiotic daily and with dressing changes prn until all wounds healed.  Given bandaids/triple antibiotic from clinic stock by RN  Kimrey.  Tetanus up to date last 2022  Discussed if nerve damaged he may experience electrical shock/tingling sensation as nerve attempts to repair itself.  If weakness/worsening pain/discharge/swelling/fever/chills to notify clinic staff same day.  "I am feeling much better today/swelling/pain down a lot"  Patient verbalized understanding information/instructions, agreed with plan of care and had no further questions at this time.

## 2023-01-03 NOTE — Progress Notes (Signed)
Wound looking much better

## 2023-01-12 ENCOUNTER — Ambulatory Visit: Payer: Self-pay | Admitting: Registered Nurse

## 2023-01-12 ENCOUNTER — Encounter: Payer: Self-pay | Admitting: Registered Nurse

## 2023-01-12 VITALS — BP 115/74 | HR 63

## 2023-01-12 DIAGNOSIS — W540XXD Bitten by dog, subsequent encounter: Secondary | ICD-10-CM

## 2023-01-12 DIAGNOSIS — M79645 Pain in left finger(s): Secondary | ICD-10-CM

## 2023-01-12 NOTE — Progress Notes (Signed)
Subjective:    Patient ID: Ronnie Hester, male    DOB: November 02, 1986, 37 y.o.   MRN: [id number]  36y/o caucasian male established patient with left thumb pain s/p home dog bite 12/31/22 worst pain end of day after work.  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  Using OTC pain medication prn.  Swelling has improved and stopped antibiotic redness resolved.  Dog did hold thumb in mouth/bite/crush.  Denied weakness/droppiing items; pain with movement thumb from base  of thumb to PIP joint denied locking/popping/weakness.     Review of Systems  Constitutional:  Negative for chills and fever.  Musculoskeletal:  Positive for myalgias. Negative for joint swelling.  Skin:  Negative for color change, pallor, rash and wound.  Neurological:  Negative for weakness.  Psychiatric/Behavioral:  Negative for agitation, confusion and sleep disturbance.        Objective:   Physical Exam Vitals and nursing note reviewed.  Constitutional:      General: He is awake. He is not in acute distress.    Appearance: Normal appearance. He is well-developed, well-groomed and normal weight. He is not ill-appearing, toxic-appearing or diaphoretic.  HENT:     Head: Normocephalic and atraumatic.     Jaw: There is normal jaw occlusion.     Salivary Glands: Right salivary gland is not diffusely enlarged. Left salivary gland is not diffusely enlarged.     Right Ear: Hearing and external ear normal.     Left Ear: Hearing and external ear normal.     Nose: Nose normal. No congestion or rhinorrhea.     Mouth/Throat:     Lips: Pink. No lesions.     Mouth: Mucous membranes are moist.     Pharynx: Oropharynx is clear.  Eyes:     General: Lids are normal. Vision grossly intact. Gaze aligned appropriately. No scleral icterus.       Right eye: No discharge.        Left eye: No discharge.     Extraocular Movements:  Extraocular movements intact.     Conjunctiva/sclera: Conjunctivae normal.     Pupils: Pupils are equal, round, and reactive to light.  Neck:     Trachea: Trachea normal.  Cardiovascular:     Rate and Rhythm: Normal rate and regular rhythm.  Pulmonary:     Effort: Pulmonary effort is normal.     Breath sounds: Normal breath sounds and air entry. No stridor or transmitted upper airway sounds. No wheezing.     Comments: Spoke full sentences without difficulty; no cough observed in exam room; wearing cloth mask in clinic Abdominal:     General: Abdomen is flat.  Musculoskeletal:        General: Signs of injury present. No swelling or tenderness. Normal range of motion.     Right forearm: No swelling, edema, deformity, lacerations, tenderness or bony tenderness.     Left forearm: No swelling, edema, deformity, lacerations, tenderness or bony tenderness.     Right wrist: No swelling, deformity, effusion, lacerations, tenderness, bony tenderness or crepitus. Normal range of motion.     Left wrist: No swelling, deformity, effusion, lacerations, tenderness, bony tenderness or crepitus. Normal range of motion.     Right hand: No swelling, deformity, lacerations, tenderness or bony tenderness. Normal range of motion. Normal strength. Normal sensation. Normal capillary refill.     Left hand: No swelling, deformity, lacerations, tenderness or bony tenderness.  Normal range of motion. Normal strength. Normal sensation. Normal capillary refill.       Arms:     Cervical back: Normal range of motion and neck supple.     Comments: Pain with flexion left thumb soft tissue overlying metacarpal bone to distal phalanx; joints not swollen/tender/hot/nor is skin; no crepitus audible/palpable thumb; no visible or palpable edema; skin temperature equal to adjacent fingers/forearms; negative finkelsteins/tinnels/phalens tests; normal ad/abduction/flexion/extension/closed fist/capillary refill less than 2 seconds  bilaterally  Lymphadenopathy:     Head:     Right side of head: No submandibular or preauricular adenopathy.     Left side of head: No submandibular or preauricular adenopathy.     Cervical:     Right cervical: No superficial cervical adenopathy.    Left cervical: No superficial cervical adenopathy.  Skin:    General: Skin is warm and dry.     Capillary Refill: Capillary refill takes less than 2 seconds.     Coloration: Skin is not ashen, cyanotic, jaundiced, mottled, pale or sallow.     Findings: No abrasion, abscess, acne, bruising, burn, ecchymosis, erythema, signs of injury, laceration, lesion, petechiae, rash or wound.     Nails: There is no clubbing.  Neurological:     General: No focal deficit present.     Mental Status: He is alert and oriented to person, place, and time. Mental status is at baseline.     GCS: GCS eye subscore is 4. GCS verbal subscore is 5. GCS motor subscore is 6.     Cranial Nerves: Cranial nerves 2-12 are intact. No cranial nerve deficit, dysarthria or facial asymmetry.     Motor: Motor function is intact. No weakness, tremor, atrophy, abnormal muscle tone or seizure activity.     Coordination: Coordination is intact. Coordination normal.     Gait: Gait is intact. Gait normal.     Comments: In/out of chair without difficulty; gait sure and steady in clinic; bilateral hand grasp equal 5/5  Psychiatric:        Attention and Perception: Attention and perception normal.        Mood and Affect: Mood and affect normal.        Speech: Speech normal.        Behavior: Behavior normal. Behavior is cooperative.        Thought Content: Thought content normal.        Cognition and Memory: Cognition and memory normal.        Judgment: Judgment normal.           Assessment & Plan:  A-pain of left thumb; dog bite subsequent visit  P-Fitted and distributed left thumb spica wrist splint large from clinic stock Given 1 roll cobain and aluminum foam finger splint  from clinic stock Use for 2 weeks then trial off at work to see if pain reoccurs.  Discussed most likely strain thumb/contusion bone/soft tissue from dog bite still healing; infection resolved after antibiotic completed and lacerations healed.  Exitcare handout thumb sprain and contusion.  May use OTC tylenol 1000mg  po q6h prn pain or motrin 800mg  po q8h prn pain take with food.  Not a work injury.  If needs work restrictions will need to see PCM as unable to write work restrictions in this clinic.  Patient given 2 different thumb splints due to different work duties and splint thumb/wrist bulky may switch to smaller aluminum foam when needed.  Patient may use cryotherapy 15 minutes QID prn pain/swelling.  Discussed remove  splint when resting at home/sleeping. Gentle AROM am/pm when splint off. Patient verbalized understanding information/instructions, agreed with plan of care and had no further questions at this time. S/p dog bite/injury not work related

## 2023-01-12 NOTE — Patient Instructions (Signed)
Thumb Sprain  A thumb sprain is an injury to one of the bands of tissue that connect bones to each other (a ligament) in your thumb. The ligament may be stretched too much, or it may be torn. A tear can be either partial or complete. How bad, or severe, the sprain is depends on how much of the ligament was damaged or torn. What are the causes? A thumb sprain is often caused by a fall or an accident, such as when you hold your hands out to catch something or to protect yourself. What increases the risk? This injury is more likely to occur in people who play sports that involve: A risk of falling, such as skiing. Catching an object, such as basketball. What are the signs or symptoms? Symptoms of this condition include: Not being able to move the thumb normally. Swelling. Tenderness. Bruising. How is this diagnosed? This condition may be diagnosed based on: Your symptoms and medical history. Your health care provider may ask about any recent injuries to your thumb. A physical exam. Imaging studies, such as X-rays, ultrasound, or MRI. How is this treated? Treatment for this condition depends on how severe your sprain is. If your ligament is overstretched or partially torn, treatment usually involves keeping your thumb in a fixed position (immobilization) for at least 4 to 6 weeks. Your health care provider will apply a bandage (dressing), splint, brace, or cast to keep your thumb from moving until it heals. If your ligament is fully torn, you may need surgery to reconnect the ligament to the bone. After surgery, you will need to wear a cast or splint on your thumb. Your health care provider may also recommend physical therapy to strengthen your thumb. Follow these instructions at home: If you have a removable splint, bandage, or brace: Wear the splint, bandage, or brace as told by your health care provider. Remove it only as told by your health care provider. Check the skin around the splint,  bandage, or brace every day. Tell your health care provider about any concerns. Loosen the splint, bandage, or brace if your thumb or fingers tingle, become numb, or turn cold and blue. Keep it clean and dry. If you have a nonremovable cast: Do not put pressure on any part of the cast until it is fully hardened. This may take several hours. Do not stick anything inside the cast to scratch your skin. Doing that increases your risk of infection. Check the skin around the cast every day. Tell your health care provider about any concerns. You may put lotion on dry skin around the edges of the cast. Do not put lotion on the skin underneath the cast. Keep it clean and dry. Bathing Do not take baths, swim, or use a hot tub until your health care provider approves. Ask your health care provider if you may take showers. You may only be allowed to take sponge baths. If your splint, bandage, brace, or cast is not waterproof: Do not let it get wet. Cover it with a watertight covering when you take a bath or shower. Managing pain, stiffness, and swelling  If directed, put ice on your thumb. To do this: If you have a removable splint, bandage, or brace, remove it as told by your health care provider. Put ice in a plastic bag. Place a towel between your skin and the bag, or between your cast and the bag. Leave the ice on for 20 minutes, 2-3 times a day. Remove the ice  if your skin turns bright red. This is very important. If you cannot feel pain, heat, or cold, you have a greater risk of damage to the area. Move your fingers often to reduce stiffness and swelling. Raise (elevate) the injured area above the level of your heart while you are sitting or lying down. Activity Return to your normal activities as told by your health care provider. Ask your health care provider what activities are safe for you. Do physical therapy exercises as directed. After your splint, bandage, brace, or cast is removed, your  health care provider may recommend that you: Move your thumb in circles. Touch your thumb to your pinky finger. Do these exercises several times a day. Ask your health care provider if you may use a hand exerciser to strengthen your muscles. If your thumb feels stiff while you are exercising it, try doing the exercises while soaking your hand in warm water. Driving Ask your health care provider when it is safe to drive if you have a splint, bandage, brace, or cast on your hand or thumb. Ask your health care provider if the medicine prescribed to you requires you to avoid driving or using machinery. General instructions Take over-the-counter and prescription medicines only as told by your health care provider. Do not use any products that contain nicotine or tobacco. These products include cigarettes, chewing tobacco, and vaping devices, such as e-cigarettes. These can delay bone healing. If you need help quitting, ask your health care provider. Do not wear rings on your injured thumb. Keep all follow-up visits. This is important. Contact a health care provider if: You have pain that gets worse or does not get better with medicine. You have bruising or swelling that gets worse. Your cast, brace, or splint is damaged. Get help right away if: Your thumb feels numb, tingles, turns cold, or turns blue, even after loosening your splint, bandage, or brace. Summary A thumb sprain is an injury to one of the bands of tissue that connect bones to each other (a ligament) in your thumb. Thumb sprains are more likely to occur in people who play sports that involve a risk of falling or having to catch an object. Treatment will depend on how severe the sprain is, but it will require keeping the thumb in a fixed position (immobilization). It might require surgery. Make sure you understand and follow all of your health care provider's instructions for home care. This information is not intended to replace  advice given to you by your health care provider. Make sure you discuss any questions you have with your health care provider. Document Revised: 10/28/2020 Document Reviewed: 10/28/2020 Elsevier Patient Education  2023 [employer] Contusion A contusion is a deep bruise. Contusions are the result of a blunt injury to tissues and muscle fibers under the skin. The injury causes bleeding under the skin. The skin over the contusion may turn blue, purple, or yellow. Minor injuries will give you a painless contusion, but more severe injuries cause contusions that can stay painful and swollen for a few weeks. Follow these instructions at home: Pay attention to any changes in your symptoms. Let your health care provider know about them. Take these actions to relieve your pain. Managing pain, stiffness, and swelling  Use resting, icing, applying pressure (compression), and raising (elevating) the injured area. This is often called the RICE method. Rest the injured area. Return to your normal activities as told by your health care provider. Ask your health care provider  what activities are safe for you. If directed, put ice on the injured area. To do this: Put ice in a plastic bag. Place a towel between your skin and the bag. Leave the ice on for 20 minutes, 2-3 times a day. If your skin turns bright red, remove the ice right away to prevent skin damage. The risk of skin damage is higher if you cannot feel pain, heat, or cold. If directed, apply light compression to the injured area using an elastic bandage. Make sure the bandage is not wrapped too tightly. Remove and reapply the bandage as directed by your health care provider. If possible, elevate the injured area above the level of your heart while you are sitting or lying down. General instructions Take over-the-counter and prescription medicines only as told by your health care provider. Keep all follow-up visits. Your health care provider may want  to see how your contusion is healing with treatment. Contact a health care provider if: Your symptoms do not improve after several days of treatment. Your symptoms get worse. You have difficulty moving the injured area. Get help right away if: You have severe pain. You have numbness in a hand or foot. Your hand or foot turns pale or cold. This information is not intended to replace advice given to you by your health care provider. Make sure you discuss any questions you have with your health care provider. Document Revised: 05/23/2022 Document Reviewed: 05/23/2022 Elsevier Patient Education  [place].

## 2023-01-13 NOTE — Telephone Encounter (Signed)
Patient seen in clinic 01/13/23 infection resolved along with swelling hands still having some left thumb pain placed in wrist thumb spica splint for work x 2 weeks suspect contusion/strain injury from dog bite.  Has full AROM see office note.

## 2023-01-18 NOTE — Telephone Encounter (Signed)
Patient came to clinic requested another thumb splint/finger only not wrist thumb spica for working today.  Stated still having pain in thumb and notes looks blue at times/some tingling numbness occurring with AROM.  Discussed with patient vein thumb distended/was injured during dog bite; nerve injury could have occurred also tingling/numbness a symptom.  Swelling is still resolved. Full AROM/strength on exam.  Capillary refill less than 2 seconds and equal to adjacent fingers temperature normal no audible or palpable crepitus.  This was a home injury not work related.  Patient to follow up with [facility] for xray/imaging/work restrictions note if unable to perform work duties.  Unable to write work restrictions in this clinic due to contract limitations.  Patient A&Ox3 spoke full sentences without difficulty.  Given plastic thumb splint from clinic stock 3" by RN Kimrey fitted and distributed to patient.  Discussed circulation to finger is healthy today and no signs of infection.  Hand grasp equal 5/5 bilaterally.  Blue coloring posterior thumb is blood vessel not bruising.  Vessel is distended/not constricted at time of evaluation.  Denied fever/chills/weakness/dropping items.  Patient agreed with plan of care and had no further questions at this time.

## 2023-01-26 ENCOUNTER — Ambulatory Visit: Payer: Self-pay | Admitting: Registered Nurse

## 2023-01-26 ENCOUNTER — Encounter: Payer: Self-pay | Admitting: Registered Nurse

## 2023-01-26 DIAGNOSIS — W540XXD Bitten by dog, subsequent encounter: Secondary | ICD-10-CM

## 2023-01-26 DIAGNOSIS — M79645 Pain in left finger(s): Secondary | ICD-10-CM

## 2023-01-26 NOTE — Progress Notes (Signed)
Subjective:    Patient ID: Ronnie Hester, male    DOB: 1986-05-11, 37 y.o.   MRN: [id number]  36y/o caucasian male established patient with left thumb pain s/p home dog bite 12/31/22 worst pain now if having to put high resistance to thumb grasp/holding items at work.  Last evaluation 01/12/23.  Used finger splint on regular basis first week after last appt.  Has not required it this week Swelling and tingling/numbness has resolved.  "I feel so much better"  "I think almost completely healed now"      Review of Systems  Constitutional:  Negative for activity change, appetite change, chills and fever.  Musculoskeletal:  Positive for arthralgias and myalgias. Negative for gait problem, joint swelling, neck pain and neck stiffness.  Skin:  Negative for color change, pallor, rash and wound.  Neurological:  Negative for tremors, syncope, weakness and numbness.  Psychiatric/Behavioral:  Negative for agitation, confusion and sleep disturbance.        Objective:   Physical Exam    Vitals and nursing note reviewed.  Constitutional:      General: He is awake. He is not in acute distress.    Appearance: Normal appearance. He is well-developed, well-groomed and normal weight. He is not ill-appearing, toxic-appearing or diaphoretic.  HENT:     Head: Normocephalic and atraumatic.     Jaw: There is normal jaw occlusion.     Salivary Glands: Right salivary gland is not diffusely enlarged. Left salivary gland is not diffusely enlarged.     Right Ear: Hearing and external ear normal.     Left Ear: Hearing and external ear normal.     Nose: Nose normal. No congestion or rhinorrhea.     Mouth/Throat:     Lips: Pink. No lesions.     Mouth: Mucous membranes are moist.     Pharynx: Oropharynx is clear.  Eyes:     General: Lids are normal. Vision grossly intact. Gaze aligned appropriately. No scleral icterus.       Right eye: No discharge.        Left eye: No discharge.     Extraocular Movements:  Extraocular movements intact.     Conjunctiva/sclera: Conjunctivae normal.     Pupils: Pupils are equal, round, and reactive to light.  Neck:     Trachea: Trachea normal.  Cardiovascular:     Rate and Rhythm: Normal rate and regular rhythm.  Pulmonary:     Effort: Pulmonary effort is normal.     Breath sounds: Normal breath sounds and air entry. No stridor or transmitted upper airway sounds. No wheezing.     Comments: Spoke full sentences without difficulty; no cough observed in exam room Abdominal:     General: Abdomen is flat.  Musculoskeletal:        General: Signs of injury present. No swelling or tenderness. Normal range of motion.     Right forearm: No swelling, edema, deformity, lacerations, tenderness or bony tenderness.     Left forearm: No swelling, edema, deformity, lacerations, tenderness or bony tenderness.     Right wrist: No swelling, deformity, effusion, lacerations, tenderness, bony tenderness or crepitus. Normal range of motion.     Left wrist: No swelling, deformity, effusion, lacerations, tenderness, bony tenderness or crepitus. Normal range of motion.     Right hand: No swelling, deformity, lacerations, tenderness or bony tenderness. Normal range of motion. Normal strength. Normal sensation. Normal capillary refill.     Left hand: No swelling, deformity, lacerations, tenderness  or bony tenderness. Normal range of motion. Normal strength. Normal sensation. Normal capillary refill.       Arms:     Cervical back: Normal range of motion and neck supple.     Comments: Thenar soft tissue slightly increased tension compared to uninjured.  Trace edema present.  No pain with flexion left thumb soft tissue overlying metacarpal bone to distal phalanx; joints not swollen/tender/hot/nor is skin; no crepitus audible/palpable thumb; no visible or palpable edema; skin temperature equal to adjacent fingers/forearms; negative finkelsteins/tinnels/phalens tests; normal  ad/abduction/flexion/extension/closed fist/capillary refill less than 2 seconds bilaterally  Lymphadenopathy:     Head:     Right side of head: No submandibular or preauricular adenopathy.     Left side of head: No submandibular or preauricular adenopathy.     Cervical:     Right cervical: No superficial cervical adenopathy.    Left cervical: No superficial cervical adenopathy.  Skin:    General: Skin is warm and dry.     Capillary Refill: Capillary refill takes less than 2 seconds.     Coloration: Skin is not ashen, cyanotic, jaundiced, mottled, pale or sallow.     Findings: No abrasion, abscess, acne, bruising, burn, ecchymosis, erythema, signs of injury, laceration, lesion, petechiae, rash or wound.     Nails: There is no clubbing.  Neurological:     General: No focal deficit present.     Mental Status: He is alert and oriented to person, place, and time. Mental status is at baseline.     GCS: GCS eye subscore is 4. GCS verbal subscore is 5. GCS motor subscore is 6.     Cranial Nerves: Cranial nerves 2-12 are intact. No cranial nerve deficit, dysarthria or facial asymmetry.     Motor: Motor function is intact. No weakness, tremor, atrophy, abnormal muscle tone or seizure activity.     Coordination: Coordination is intact. Coordination normal.     Gait: Gait is intact. Gait normal.     Comments: gait sure and steady in clinic; bilateral hand grasp equal 5/5  Psychiatric:        Attention and Perception: Attention and perception normal.        Mood and Affect: Mood and affect normal.        Speech: Speech normal.        Behavior: Behavior normal. Behavior is cooperative.        Thought Content: Thought content normal.        Cognition and Memory: Cognition and memory normal.        Judgment: Judgment normal.     No palpation or AROM pain thumb today full AROM trace nonpitting swelling compared to right; capillary refill equal bilateral less than 2 seconds Discussed splint prn if  worsening pain at work/ice/meds    Assessment   Pain of left thumb subsequent visit, dog bite subsequent visit  P-Healing continue exercises/AROM.  Splint prn use if worsening pain/edema with increasing activity.   May use OTC tylenol 1025m po q6h prn pain or motrin 8036mpo q8h prn pain take with food.  Not a work injury.  If needs work restrictions will need to see PCM as unable to write work restrictions in this clinic.  Patient given 2 different thumb splints due to different work duties and splint thumb/wrist bulky may switch to smaller aluminum foam when needed.  Patient may use cryotherapy 15 minutes QID prn pain/swelling.  Discussed remove splint when resting at home/sleeping. Gentle AROM am/pm when splint  off. Patient verbalized understanding information/instructions, agreed with plan of care and had no further questions at this time. S/p dog bite/injury not work related

## 2023-01-27 NOTE — Telephone Encounter (Signed)
See office note dated 01/26/23 continued improvement pain intermittent and not requiring splint trace edema thenar soft tissue.

## 2023-04-25 ENCOUNTER — Ambulatory Visit: Payer: Self-pay | Admitting: Registered Nurse

## 2023-04-25 ENCOUNTER — Encounter: Payer: Self-pay | Admitting: Registered Nurse

## 2023-04-25 VITALS — BP 124/81 | HR 81

## 2023-04-25 DIAGNOSIS — R590 Localized enlarged lymph nodes: Secondary | ICD-10-CM

## 2023-04-25 DIAGNOSIS — S0006XA Insect bite (nonvenomous) of scalp, initial encounter: Secondary | ICD-10-CM

## 2023-04-25 DIAGNOSIS — Z0000 Encounter for general adult medical examination without abnormal findings: Secondary | ICD-10-CM

## 2023-04-25 DIAGNOSIS — W57XXXA Bitten or stung by nonvenomous insect and other nonvenomous arthropods, initial encounter: Secondary | ICD-10-CM

## 2023-04-25 DIAGNOSIS — R7989 Other specified abnormal findings of blood chemistry: Secondary | ICD-10-CM

## 2023-04-25 MED ORDER — TRIPLE ANTIBIOTIC 5-400-5000 EX OINT: TOPICAL_OINTMENT | Freq: Two times a day (BID) | CUTANEOUS | 0 refills | Status: AC

## 2023-04-25 MED ORDER — DOXYCYCLINE HYCLATE 100 MG PO TABS: 100.0000 mg | ORAL_TABLET | Freq: Two times a day (BID) | ORAL | 0 refills | Status: AC

## 2023-04-25 NOTE — Progress Notes (Signed)
Subjective:    Patient ID: Ronnie Hester, male    DOB: 14-May-1986, 37 y.o.   MRN: [id number]  37y/o established caucasian male here for evaluation lump in neck posterior size of pea.  Denied fever/chills/body aches/recent illness/cough/cold.  Stated did remove tick from scalp a couple days ago and swollen/sore.  Removed entire body/head denied discharge/bleeding from site.  Wears hat at work.  Patient also requested help to get established with new PCM      Review of Systems  Constitutional:  Negative for chills, diaphoresis, fatigue and fever.  HENT:  Negative for trouble swallowing and voice change.   Eyes:  Negative for photophobia and visual disturbance.  Respiratory:  Negative for cough, shortness of breath, wheezing and stridor.   Gastrointestinal:  Negative for diarrhea, nausea and vomiting.  Genitourinary:  Negative for difficulty urinating.  Musculoskeletal:  Negative for gait problem, myalgias, neck pain and neck stiffness.  Skin:  Positive for wound.  Neurological:  Negative for dizziness, tremors, syncope, facial asymmetry, speech difficulty, weakness, light-headedness, numbness and headaches.  Hematological:  Positive for adenopathy. Does not bruise/bleed easily.  Psychiatric/Behavioral:  Negative for agitation, confusion and sleep disturbance.        Objective:   Physical Exam Vitals and nursing note reviewed.  Constitutional:      General: He is awake. He is not in acute distress.    Appearance: Normal appearance. He is well-developed, well-groomed and normal weight. He is not ill-appearing, toxic-appearing or diaphoretic.  HENT:     Head: Normocephalic. Abrasion present. No raccoon eyes, Battle's sign, contusion, masses, right periorbital erythema, left periorbital erythema or laceration. Hair is normal.     Jaw: There is normal jaw occlusion.     Salivary Glands: Right salivary gland is not diffusely enlarged or tender. Left salivary gland is not diffusely  enlarged or tender.      Comments: Scalp nummular erythema localized edema nonpitting 1cm no fluctuance dry posterior left of midline proximal    Right Ear: Hearing and external ear normal.     Left Ear: Hearing and external ear normal.     Nose: Nose normal. No congestion or rhinorrhea.     Mouth/Throat:     Lips: Pink. No lesions.     Mouth: Mucous membranes are moist. No oral lesions or angioedema.     Dentition: No gum lesions.     Tongue: No lesions. Tongue does not deviate from midline.     Palate: No mass and lesions.     Pharynx: Uvula midline. Pharyngeal swelling and posterior oropharyngeal erythema present.     Tonsils: No tonsillar exudate.     Comments: Cobblestoning posterior pharyxn; bilateral allergic shiners; wearing glasses NVA grossly normal Eyes:     General: Lids are normal. Vision grossly intact. Gaze aligned appropriately. No scleral icterus.       Right eye: No discharge.        Left eye: No discharge.     Extraocular Movements: Extraocular movements intact.     Conjunctiva/sclera: Conjunctivae normal.     Pupils: Pupils are equal, round, and reactive to light.  Neck:     Trachea: Trachea normal.  Cardiovascular:     Rate and Rhythm: Normal rate and regular rhythm.     Pulses: Normal pulses.  Pulmonary:     Effort: Pulmonary effort is normal.     Breath sounds: Normal breath sounds and air entry. No stridor or transmitted upper airway sounds. No wheezing.  Comments: Spoke full sentences without difficulty; no cough observed in exam room Abdominal:     General: Abdomen is flat.  Musculoskeletal:        General: Normal range of motion.     Right hand: Normal strength. Normal capillary refill.     Left hand: Normal strength. Normal capillary refill.     Cervical back: Normal range of motion and neck supple. No rigidity or tenderness.  Lymphadenopathy:     Head:     Right side of head: No submandibular or preauricular adenopathy.     Left side of head:  No submandibular or preauricular adenopathy.     Cervical: Cervical adenopathy present.     Right cervical: No superficial, deep or posterior cervical adenopathy.    Left cervical: Posterior cervical adenopathy present. No superficial or deep cervical adenopathy.     Comments: 4mm shotty right posterior cervical lymph node enlarged nontender  Skin:    General: Skin is warm and dry.     Capillary Refill: Capillary refill takes less than 2 seconds.     Coloration: Skin is not ashen, cyanotic, jaundiced, mottled, pale or sallow.     Findings: Abrasion, erythema and rash present. No abscess, acne, bruising, burn, ecchymosis, signs of injury, laceration, lesion, petechiae or wound. Rash is macular. Rash is not crusting, nodular, papular, purpuric, pustular, scaling, urticarial or vesicular.     Nails: There is no clubbing.       Neurological:     General: No focal deficit present.     Mental Status: He is alert and oriented to person, place, and time. Mental status is at baseline.     GCS: GCS eye subscore is 4. GCS verbal subscore is 5. GCS motor subscore is 6.     Cranial Nerves: Cranial nerves 2-12 are intact. No cranial nerve deficit, dysarthria or facial asymmetry.     Sensory: Sensation is intact.     Motor: Motor function is intact. No weakness, tremor, atrophy, abnormal muscle tone or seizure activity.     Coordination: Coordination is intact. Coordination normal.     Gait: Gait is intact. Gait normal.     Comments: In/out of chair without difficulty; gait sure and steady in clinic; bilateral hand grasp equal 5/5  Psychiatric:        Attention and Perception: Attention and perception normal.        Mood and Affect: Mood and affect normal.        Speech: Speech normal.        Behavior: Behavior normal. Behavior is cooperative.        Thought Content: Thought content normal.        Cognition and Memory: Cognition and memory normal.        Judgment: Judgment normal.            Assessment & Plan:   A-tick bite of scalp initial visit, cervical lymphadenopathy, preventative health care  P-doxycycline 100mg  po BID  x 10 days #20 RF0 dispensed to patient from [facility].  Discussed skin infection versus lyme disease as recent tick removal/localized swelling/redness 1cm diameter nummular rash and posterior cervical lymph node imflamed.  Discussed may take with or without food.  Take first dose now and next prior to bed time.  Tomorrow determine better 12 hour schedule so he can finish out bottle on consistent time schedule.  Discussed may sunburn easier the next 2 weeks use protective clothing/sunscreen. Discussed enlarged lymph node should resolve over the next  6 weeks if not to notify clinic staff/PCM. Did not require oral steroids.  One small lesions greater than 72 hours since last tick removed utilizing tweezers.  Allergic to aspirin and tramadol  Discussed symptoms do not match STARI/RMSF but could be consistent with lyme and ehrlichosis per up to date.  Lyme would require doxycycline 100mg  po BID round of antibiotics.  Avoid scratching and picking at areas ticks removed.  May apply calagel 2% apply BID prn itching and triple antibiotic topical BID or aquaphor/eucerin/vaseline to speed healing.  Wash affected areas daily with soap and water.  May apply ice 15 minutes po TID prn pain/swelling.  Exitcare handouts on tick bite, lyme disease printed and given to patient. If rash has worsening erythema, pain, purulent discharge, fever to notify clinic staff/seek re-evaluation with provider  Consider treatment of clothes with permethrin and using DEET on skin that is not covered.  follow manufacturers instructions for permethrin reapplication typically after a set number of launderings and ensure when applying permethrin to clothes in well ventilated area.  Patient verbalized understanding, agreed with plan of care and had no further questions at this time.   RN Kimrey scheduling patient  fasting labs prefers 0800 time slot M-F.  Discussed with patient has not had [facility] labs performed in previous year.  RN Bess Kinds also assisting patient to find provider that accepts [employer] insurance/network and accepting new patients for PCM appt. Vitamin D, Hgba1c and executive panel ordered for patient fasting.  Patient verbalized understanding information/instructions, agreed with plan of care and had no further questions at this time.

## 2023-04-25 NOTE — Patient Instructions (Signed)
Lymphadenopathy  Lymphadenopathy means that your lymph glands are swollen or larger than normal. Lymph glands, also called lymph nodes, are collections of tissue that filter excess fluid, bacteria, viruses, and waste from your bloodstream. They are part of your body's disease-fighting system (immune system), which protects your body from germs. There may be different causes of lymphadenopathy, depending on where it is in your body. Some types go away on their own. Lymphadenopathy can occur anywhere that you have lymph glands, including these areas: Neck (cervical lymphadenopathy). Chest (mediastinal lymphadenopathy). Lungs (hilar lymphadenopathy). Underarms (axillary lymphadenopathy). Groin (inguinal lymphadenopathy). When your immune system responds to germs, infection-fighting cells and fluid build up in your lymph glands. This causes some swelling and enlargement. If the lymph nodes do not go back to normal size after you have an infection or disease, your health care provider may do tests. These tests help to monitor your condition and find the reason why the glands are still swollen and enlarged. Follow these instructions at home:  Get plenty of rest. Your health care provider may recommend over-the-counter medicines for pain. Take over-the-counter and prescription medicines only as told by your health care provider. If directed, apply heat to swollen lymph glands as often as told by your health care provider. Use the heat source that your health care provider recommends, such as a moist heat pack or a heating pad. Place a towel between your skin and the heat source. Leave the heat on for 20-30 minutes. Remove the heat if your skin turns bright red. This is especially important if you are unable to feel pain, heat, or cold. You may have a greater risk of getting burned. Check your affected lymph glands every day for changes. Check other lymph gland areas as told by your health care provider.  Check for changes such as: More swelling. Sudden increase in size. Redness or pain. Hardness. Keep all follow-up visits. This is important. Contact a health care provider if you have: Lymph glands that: Are still swollen after 2 weeks. Have suddenly gotten bigger or the swelling spreads. Are red, painful, or hard. Fluid leaking from the skin near an enlarged lymph gland. Problems with breathing. A fever, chills, or night sweats. Fatigue. A sore throat. Pain in your abdomen. Weight loss. Get help right away if you have: Severe pain. Chest pain. Shortness of breath. These symptoms may represent a serious problem that is an emergency. Do not wait to see if the symptoms will go away. Get medical help right away. Call your local emergency services (911 in the [place] Do not drive yourself to the hospital. Summary Lymphadenopathy means that your lymph glands are swollen or larger than normal. Lymph glands, also called lymph nodes, are collections of tissue that filter excess fluid, bacteria, viruses, and waste from the bloodstream. They are part of your body's disease-fighting system (immune system). Lymphadenopathy can occur anywhere that you have lymph glands. If the lymph nodes do not go back to normal size after you have an infection or disease, your health care provider may do tests to monitor your condition and find the reason why the glands are still swollen and enlarged. Check your affected lymph glands every day for changes. Check other lymph gland areas as told by your health care provider. This information is not intended to replace advice given to you by your health care provider. Make sure you discuss any questions you have with your health care provider. Document Revised: 09/30/2020 Document Reviewed: 09/30/2020 Elsevier Patient Education    2023 Elsevier Inc.  

## 2023-04-26 ENCOUNTER — Telehealth: Payer: Self-pay | Admitting: Registered Nurse

## 2023-04-26 ENCOUNTER — Other Ambulatory Visit: Payer: Self-pay | Admitting: Occupational Medicine

## 2023-04-26 DIAGNOSIS — Z0000 Encounter for general adult medical examination without abnormal findings: Secondary | ICD-10-CM

## 2023-04-26 DIAGNOSIS — F17200 Nicotine dependence, unspecified, uncomplicated: Secondary | ICD-10-CM | POA: Insufficient documentation

## 2023-04-26 DIAGNOSIS — F17210 Nicotine dependence, cigarettes, uncomplicated: Secondary | ICD-10-CM | POA: Insufficient documentation

## 2023-04-26 MED ORDER — NICOTINE 14 MG/24HR TD PT24: 14.0000 mg | MEDICATED_PATCH | Freq: Every day | TRANSDERMAL | 0 refills | Status: DC

## 2023-04-26 MED ORDER — NICOTINE 14 MG/24HR TD PT24: 14.0000 mg | MEDICATED_PATCH | Freq: Every day | TRANSDERMAL | 0 refills | Status: AC

## 2023-04-26 NOTE — Progress Notes (Signed)
Lab drawn from Left AC tolerated well no issues noted.   

## 2023-04-26 NOTE — Telephone Encounter (Signed)
-----   Message from Dagoberto Ligas, RN sent at 04/26/2023  8:32 AM EDT ----- Regarding: Smoke cessation Needs nicotine patches called into pharmacy. Pt smokes 1/2 pack a day has tried to quit couple times and patch help till the last stage.

## 2023-04-26 NOTE — Telephone Encounter (Signed)
Patient asked for pharmacy change to [facility], [place], [place] [facility] sent.

## 2023-04-26 NOTE — Telephone Encounter (Signed)
Patient reported to RN Kimrey tobacco cigarette use has tried to quit in the past currently using 1/2 PPD and patch has helped in the past with quit efforts would like new Rx sent to his pharmacy.  Patient was given Brain Shark video link to watch.  RN Kimrey notified patient employer has incentive to quit smoking monetary reward after completing program and 90 days of quitting tobacco use/vaping.  Insurance discount available if passes lab/BP requirements and watches brain shark video and passes quiz also for [employer].  Exitcare video on tips for tobacco cessation and nicotine patches information sent to patient my chart.  Electronic Rx to patient pharmacy of choice nicotine transdermal topical daily apply 1 patch and remove old patch #28 RF0 per epocrates to start at 14mg   x 6 weeks if 6-10 cigarettes per day habit then decrease to 7mg  patch daily x 2 weeks and stop cigarette use at therapy onset. Message left for patient.   Patient agreed with plan of care and had no further questions at that time.

## 2023-04-27 ENCOUNTER — Encounter: Payer: Self-pay | Admitting: Registered Nurse

## 2023-04-27 LAB — CMP12+LP+TP+TSH+6AC+CBC/D/PLT
ALT: 10 IU/L (ref 0–44)
AST: 19 IU/L (ref 0–40)
Albumin/Globulin Ratio: 2 (ref 1.2–2.2)
Albumin: 4.7 g/dL (ref 4.1–5.1)
Alkaline Phosphatase: 73 IU/L (ref 44–121)
BUN/Creatinine Ratio: 14 (ref 9–20)
BUN: 11 mg/dL (ref 6–20)
Basophils Absolute: 0 10*3/uL (ref 0.0–0.2)
Basos: 0 %
Bilirubin Total: 0.3 mg/dL (ref 0.0–1.2)
Calcium: 9.3 mg/dL (ref 8.7–10.2)
Chloride: 100 mmol/L (ref 96–106)
Chol/HDL Ratio: 4.2 ratio (ref 0.0–5.0)
Cholesterol, Total: 163 mg/dL (ref 100–199)
Creatinine, Ser: 0.81 mg/dL (ref 0.76–1.27)
EOS (ABSOLUTE): 0.2 10*3/uL (ref 0.0–0.4)
Eos: 3 %
Estimated CHD Risk: 0.8 times avg. (ref 0.0–1.0)
Free Thyroxine Index: 1.7 (ref 1.2–4.9)
GGT: 9 IU/L (ref 0–65)
Globulin, Total: 2.3 g/dL (ref 1.5–4.5)
Glucose: 84 mg/dL (ref 70–99)
HDL: 39 mg/dL — ABNORMAL LOW (ref >39)
Hematocrit: 44.5 % (ref 37.5–51.0)
Hemoglobin: 15 g/dL (ref 13.0–17.7)
Immature Grans (Abs): 0 10*3/uL (ref 0.0–0.1)
Immature Granulocytes: 0 %
Iron: 83 ug/dL (ref 38–169)
LDH: 193 IU/L (ref 121–224)
LDL Chol Calc (NIH): 114 mg/dL — ABNORMAL HIGH (ref 0–99)
Lymphocytes Absolute: 2.2 10*3/uL (ref 0.7–3.1)
Lymphs: 34 %
MCH: 31.4 pg (ref 26.6–33.0)
MCHC: 33.7 g/dL (ref 31.5–35.7)
MCV: 93 fL (ref 79–97)
Monocytes Absolute: 0.4 10*3/uL (ref 0.1–0.9)
Monocytes: 6 %
Neutrophils Absolute: 3.5 10*3/uL (ref 1.4–7.0)
Neutrophils: 57 %
Phosphorus: 3.8 mg/dL (ref 2.8–4.1)
Platelets: 265 10*3/uL (ref 150–450)
Potassium: 4.6 mmol/L (ref 3.5–5.2)
RBC: 4.78 x10E6/uL (ref 4.14–5.80)
RDW: 12.1 % (ref 11.6–15.4)
Sodium: 142 mmol/L (ref 134–144)
T3 Uptake Ratio: 23 % — ABNORMAL LOW (ref 24–39)
T4, Total: 7.2 ug/dL (ref 4.5–12.0)
TSH: 0.656 u[IU]/mL (ref 0.450–4.500)
Total Protein: 7 g/dL (ref 6.0–8.5)
Triglycerides: 46 mg/dL (ref 0–149)
Uric Acid: 5.5 mg/dL (ref 3.8–8.4)
VLDL Cholesterol Cal: 10 mg/dL (ref 5–40)
WBC: 6.3 10*3/uL (ref 3.4–10.8)
eGFR: 119 mL/min/{1.73_m2} (ref >59)

## 2023-04-27 LAB — VITAMIN D 25 HYDROXY (VIT D DEFICIENCY, FRACTURES): Vit D, 25-Hydroxy: 24.8 ng/mL — ABNORMAL LOW (ref 30.0–100.0)

## 2023-04-27 LAB — HEMOGLOBIN A1C
Est. average glucose Bld gHb Est-mCnc: 108 mg/dL
Hgb A1c MFr Bld: 5.4 % (ref 4.8–5.6)

## 2023-04-27 MED ORDER — CHOLECALCIFEROL 1.25 MG (50000 UT) PO TABS: 1.0000 | ORAL_TABLET | ORAL | 1 refills | Status: DC

## 2023-04-27 NOTE — Addendum Note (Signed)
Addended by: Albina Billet A on: 04/27/2023 12:18 PM   Modules accepted: Orders

## 2023-05-01 ENCOUNTER — Ambulatory Visit: Payer: Self-pay | Admitting: Occupational Medicine

## 2023-05-02 ENCOUNTER — Ambulatory Visit: Payer: Self-pay | Admitting: Occupational Medicine

## 2023-05-02 DIAGNOSIS — Z0000 Encounter for general adult medical examination without abnormal findings: Secondary | ICD-10-CM

## 2023-05-02 DIAGNOSIS — F17210 Nicotine dependence, cigarettes, uncomplicated: Secondary | ICD-10-CM

## 2023-05-02 DIAGNOSIS — R7989 Other specified abnormal findings of blood chemistry: Secondary | ICD-10-CM

## 2023-05-02 NOTE — Progress Notes (Signed)
[facility] insurance premium discount evaluation: Met with alternative   Patient is smoker , tobacco cessation alteratives started today nicotine patches. Brainshark video and quiz  completed.   Epic reviewed by RN Bess Kinds and transcribed labs and reviewed with the patient. Scheduled follow lab. Tobacco attestation signed. [facility] ROI formed signed. Forms placed in the chart.   Patient given handouts for [facility] pharmacies and discount drugs list,MyChart, Tele doc setup, Tele doc [address], [place] counseling and [employer] counseling.  What to do for infectious illness protocol. Given handout for list of medications that can be filled at [facility]. Given Clinic hours and Clinic Email.

## 2023-05-05 ENCOUNTER — Ambulatory Visit: Payer: Self-pay | Admitting: Family Medicine

## 2023-05-14 NOTE — Telephone Encounter (Signed)
Patient reported he was able to pick up patches and wearing first one last week  denied concerns feeling well

## 2023-05-24 ENCOUNTER — Ambulatory Visit: Payer: Self-pay | Admitting: Family Medicine

## 2023-05-26 ENCOUNTER — Ambulatory Visit: Payer: Self-pay | Admitting: Family Medicine

## 2023-05-31 ENCOUNTER — Ambulatory Visit: Payer: Self-pay | Admitting: Occupational Medicine

## 2023-05-31 ENCOUNTER — Telehealth: Payer: Self-pay | Admitting: Registered Nurse

## 2023-05-31 VITALS — BP 126/91 | HR 75 | Temp 97.7°F | Resp 18

## 2023-05-31 DIAGNOSIS — J069 Acute upper respiratory infection, unspecified: Secondary | ICD-10-CM

## 2023-05-31 DIAGNOSIS — F17210 Nicotine dependence, cigarettes, uncomplicated: Secondary | ICD-10-CM

## 2023-05-31 NOTE — Progress Notes (Signed)
Runny nose cough congestion sore throat for 2 days. Nasal drainage is green. Recently been traveling. Patient is taking 2 dose of amoxicillin from old script. Educated not to do that. VVS stable. Patient given nasal decongestants for night time when congestion is worse. Saline spray and cough drops. Educated to contact clinic if worsening or fever. Covid test negative.

## 2023-06-02 ENCOUNTER — Encounter: Payer: Self-pay | Admitting: Registered Nurse

## 2023-06-02 NOTE — Telephone Encounter (Signed)
Spoke with patient in workcenter stated has been using nicotine transdermal x 2 weeks.  Stopped all cigarette use on day first patch applied.  Notices some throat itching since starting transdermal patches but denied other problems/side effects and working well for him to control cravings.  Discussed to see RN Kimrey for BP check M-Th 8-5 before next refill Rx.  Skin warm dry and pink respirations even and unlabored.  Denied headache/chest pain/visual changes.  Patient A&Ox3 agreed with plan of care and had no further questions at this time.

## 2023-06-06 ENCOUNTER — Encounter: Payer: Self-pay | Admitting: Family Medicine

## 2023-06-06 ENCOUNTER — Ambulatory Visit: Payer: No Typology Code available for payment source | Admitting: Family Medicine

## 2023-06-06 ENCOUNTER — Encounter: Payer: Self-pay | Admitting: Occupational Medicine

## 2023-06-06 VITALS — BP 130/86 | HR 88 | Temp 98.0°F | Ht 76.0 in | Wt 198.3 lb

## 2023-06-06 DIAGNOSIS — E559 Vitamin D deficiency, unspecified: Secondary | ICD-10-CM

## 2023-06-06 DIAGNOSIS — F419 Anxiety disorder, unspecified: Secondary | ICD-10-CM | POA: Diagnosis not present

## 2023-06-06 DIAGNOSIS — Z114 Encounter for screening for human immunodeficiency virus [HIV]: Secondary | ICD-10-CM

## 2023-06-06 DIAGNOSIS — Z1159 Encounter for screening for other viral diseases: Secondary | ICD-10-CM

## 2023-06-06 DIAGNOSIS — Z2981 Encounter for HIV pre-exposure prophylaxis: Secondary | ICD-10-CM

## 2023-06-06 DIAGNOSIS — F17200 Nicotine dependence, unspecified, uncomplicated: Secondary | ICD-10-CM | POA: Diagnosis not present

## 2023-06-06 MED ORDER — EMTRICITABINE-TENOFOVIR DF 200-300 MG PO TABS
1.0000 | ORAL_TABLET | Freq: Every day | ORAL | 2 refills | Status: DC
Start: 2023-06-06 — End: 2023-07-03

## 2023-06-06 NOTE — Assessment & Plan Note (Signed)
We discussed methods for smoking cessation, spent 10 minutes on this topic, pt has the 14 mg patches already at home. He will consider adding wellbutrin to help reduce cravings.

## 2023-06-06 NOTE — Progress Notes (Signed)
New Patient Office Visit  Subjective    Patient ID: Ronnie Hester, male    DOB: Nov 27, 1986  Age: 37 y.o. MRN: [id number]  CC:  Chief Complaint  Patient presents with   New Patient (Initial Visit)   Establish Care    HPI Ronnie Hester presents to establish care Patient had a previous PCP. States that he has a history of anxiety in the past, was previously taking lexapro. States that the medication helps but he doesn't like to stay on it for long periods of time. Has been off of the medication for the last year. Reports that his anxiety levels are stable, has developed more positive coping skills and feels like he is managing it "ok", was taking 10 mg daily in the past.   Patient had bloodwork recently, I have reviewed this with him, he had a low vitamin D level and is on weekly supplements.   Patient smokes regularly, states that he is trying to quit smoking, has used nicotine patches in the past. We discussed options for treatment including nicotine replacement, varencycline, wellbutrin, etc. Spent 10 minutes discussing smoking cessation today with patient.   Patient also had questions about pre-exposure prophylaxis for HIV. States that he is interested in Truvada for this. We discussed the risks/benefits of the medication.   Current Outpatient Medications  Medication Instructions   emtricitabine-tenofovir (TRUVADA) 200-300 MG tablet 1 tablet, Oral, Daily   nicotine (NICODERM CQ - DOSED IN MG/24 HOURS) 14 mg, Daily   Vitamin D (Ergocalciferol) (DRISDOL) 50,000 Units, Oral, Weekly    Past Medical History:  Diagnosis Date   Anxiety    Depression     Past Surgical History:  Procedure Laterality Date   LUNG SURGERY      Family History  Problem Relation Age of Onset   Cancer Mother        skin   Diabetes Father    Alcohol abuse Father     Social History   Socioeconomic History   Marital status: Single    Spouse name: Not on file   Number of children: Not on file    Years of education: Not on file   Highest education level: Not on file  Occupational History   Not on file  Tobacco Use   Smoking status: Every Day    Packs/day: 0.50    Years: 10.00    Additional pack years: 0.00    Total pack years: 5.00    Types: Cigarettes   Smokeless tobacco: Not on file   Tobacco comments:    Currently have patches that im trying to moce to at this time.  Vaping Use   Vaping Use: Never used  Substance and Sexual Activity   Alcohol use: Yes    Comment: socially   Drug use: No   Sexual activity: Not on file  Other Topics Concern   Not on file  Social History Narrative   Not on file   Social Determinants of Health   Financial Resource Strain: Not on file  Food Insecurity: Not on file  Transportation Needs: Not on file  Physical Activity: Not on file  Stress: Not on file  Social Connections: Not on file  Intimate Partner Violence: Not on file    Review of Systems  All other systems reviewed and are negative.       Objective    BP 130/86 (BP Location: Left Arm, Patient Position: Sitting, Cuff Size: Normal)   Pulse 88   Temp 98 F (  36.7 C) (Oral)   Ht 6\' 4"  (1.93 m)   Wt 198 lb 4.8 oz (89.9 kg)   SpO2 98%   BMI 24.14 kg/m   Physical Exam Vitals reviewed.  Constitutional:      Appearance: Normal appearance. He is well-groomed and normal weight.  Eyes:     Extraocular Movements: Extraocular movements intact.     Conjunctiva/sclera: Conjunctivae normal.  Neck:     Thyroid: No thyromegaly.  Cardiovascular:     Rate and Rhythm: Normal rate and regular rhythm.     Heart sounds: S1 normal and S2 normal. No murmur heard. Pulmonary:     Effort: Pulmonary effort is normal.     Breath sounds: Normal breath sounds and air entry. No rales.  Abdominal:     General: Abdomen is flat. Bowel sounds are normal.  Musculoskeletal:     Right lower leg: No edema.     Left lower leg: No edema.  Neurological:     General: No focal deficit  present.     Mental Status: He is alert and oriented to person, place, and time.     Gait: Gait is intact.  Psychiatric:        Mood and Affect: Mood and affect normal.    Last metabolic panel Lab Results  Component Value Date   GLUCOSE 84 04/26/2023   NA 142 04/26/2023   K 4.6 04/26/2023   CL 100 04/26/2023   CO2 28 08/10/2015   BUN 11 04/26/2023   CREATININE 0.81 04/26/2023   EGFR 119 04/26/2023   CALCIUM 9.3 04/26/2023   PHOS 3.8 04/26/2023   PROT 7.0 04/26/2023   ALBUMIN 4.7 04/26/2023   LABGLOB 2.3 04/26/2023   AGRATIO 2.0 04/26/2023   BILITOT 0.3 04/26/2023   ALKPHOS 73 04/26/2023   AST 19 04/26/2023   ALT 10 04/26/2023   ANIONGAP 7 08/10/2015   Last lipids Lab Results  Component Value Date   CHOL 163 04/26/2023   HDL 39 (L) 04/26/2023   LDLCALC 114 (H) 04/26/2023   TRIG 46 04/26/2023   CHOLHDL 4.2 04/26/2023   Last thyroid functions Lab Results  Component Value Date   TSH 0.656 04/26/2023   T4TOTAL 7.2 04/26/2023   Last vitamin D Lab Results  Component Value Date   VD25OH 24.8 (L) 04/26/2023        Assessment & Plan:  Vitamin D deficiency Assessment & Plan: Seen on labs last month. He is on once weekly supplements, will recheck his levels in 3 months at his next visit.    Anxiety Assessment & Plan: We discussed other medications that could help treat anxiety without the sexual side effects including wellbutrin and cymbalta. Patient states he is doing ok for now, he will think about the medications we discussed. He states he is managing it with coping skills for now.    Encounter for screening for HIV -     HIV Antibody (routine testing w [facility])  Need for hepatitis C screening test -     Hepatitis C antibody  Encounter for HIV pre-exposure prophylaxis Assessment & Plan: Will screen for HIV today and Hep C, will send order for Truvada 1 tablet daily to the pharmacy, will see him back in 3 months for liver function testing and additional  screening.   Orders: -     Emtricitabine-Tenofovir DF; Take 1 tablet by mouth daily.  Dispense: 30 tablet; Refill: 2  Tobacco dependence Assessment & Plan: We discussed methods for smoking cessation, spent  10 minutes on this topic, pt has the 14 mg patches already at home. He will consider adding wellbutrin to help reduce cravings.      Return in about 3 months (around 09/06/2023) for follow up and blood work.   Karie Georges, MD

## 2023-06-06 NOTE — Assessment & Plan Note (Signed)
We discussed other medications that could help treat anxiety without the sexual side effects including wellbutrin and cymbalta. Patient states he is doing ok for now, he will think about the medications we discussed. He states he is managing it with coping skills for now.

## 2023-06-06 NOTE — Assessment & Plan Note (Signed)
Seen on labs last month. He is on once weekly supplements, will recheck his levels in 3 months at his next visit.

## 2023-06-06 NOTE — Assessment & Plan Note (Signed)
Will screen for HIV today and Hep C, will send order for Truvada 1 tablet daily to the pharmacy, will see him back in 3 months for liver function testing and additional screening.

## 2023-06-07 ENCOUNTER — Encounter: Payer: Self-pay | Admitting: Family Medicine

## 2023-06-07 DIAGNOSIS — F419 Anxiety disorder, unspecified: Secondary | ICD-10-CM

## 2023-06-07 DIAGNOSIS — F17200 Nicotine dependence, unspecified, uncomplicated: Secondary | ICD-10-CM

## 2023-06-07 LAB — HIV ANTIBODY (ROUTINE TESTING W REFLEX): HIV 1&2 Ab, 4th Generation: NONREACTIVE

## 2023-06-07 LAB — HEPATITIS C ANTIBODY: Hepatitis C Ab: NONREACTIVE

## 2023-06-08 MED ORDER — BUPROPION HCL ER (XL) 150 MG PO TB24
150.0000 mg | ORAL_TABLET | Freq: Every day | ORAL | 2 refills | Status: DC
Start: 2023-06-08 — End: 2023-07-28

## 2023-06-14 ENCOUNTER — Ambulatory Visit: Payer: No Typology Code available for payment source | Admitting: Occupational Medicine

## 2023-06-14 VITALS — BP 113/84 | HR 89

## 2023-06-14 DIAGNOSIS — L237 Allergic contact dermatitis due to plants, except food: Secondary | ICD-10-CM

## 2023-06-14 DIAGNOSIS — Z0130 Encounter for examination of blood pressure without abnormal findings: Secondary | ICD-10-CM

## 2023-06-14 NOTE — Progress Notes (Signed)
Patient blood pressure is doing better. Recently saw New PCP started on Wellbutrin. Patient reports nicotine patching helping great during week trouble using on weekends. He reports working on it. Patient working in the yard got poison ivy on bilateral arms. Mild only couple areas mild itching. Patient given hydrocortisone and calagel for itching. Encourage him to get calamine lotion to dry the poison ivy. Educated if worsening to contact the clinic.

## 2023-06-14 NOTE — Telephone Encounter (Signed)
Noted epic reviewed patient; BP today with RN Kimrey 118/84 and patient saw PCM started on wellbutrin and using transdermal patches.  Also has poison ivy rash this week.  Patient reported easy not to smoke during weekdays but having cravings weekends.

## 2023-06-15 ENCOUNTER — Ambulatory Visit: Payer: No Typology Code available for payment source | Admitting: Physician Assistant

## 2023-06-15 VITALS — BP 118/73 | HR 85 | Temp 97.0°F | Resp 18

## 2023-06-15 DIAGNOSIS — L237 Allergic contact dermatitis due to plants, except food: Secondary | ICD-10-CM

## 2023-06-15 MED ORDER — PREDNISONE 10 MG PO TABS
ORAL_TABLET | ORAL | 0 refills | Status: DC
Start: 2023-06-15 — End: 2023-06-27

## 2023-06-15 NOTE — Progress Notes (Addendum)
Replacements   Office Visit Note  Patient Name: Ronnie Hester  [id number]  [id number]  Date of Service: 06/15/2023  No chief complaint on file.    HPI Pt is here for a sick visit. Yesterday, he received support for newly reported poison ivy, depsite wearing protection while out in the woods. At that time, the poison ivy was only on his bilateral arms. He was given hydrocortisone and calagel for itching and informed to also use calamine lotion and oatmeal for inflammation.   He has been using the above topical treatment since then but unfortunately his rash has spread.   He confirmed that he did switch out his bath towels and washed the clothes hat he was wearing.  He has not yet switched out his bedsheets.  Today, he presents with ongoing patches of poison ivy dermatitis\ along his bilateral arms, bilateral hands --however, he has new patches now extending onto his torso now and along his central abdomen and wrapping around his left flank.  Areas on leg. He is understandably concerned that the patches seem to be nearing his inguinal / groin area.    Current Medication:  Outpatient Encounter Medications as of 06/15/2023  Medication Sig   buPROPion (WELLBUTRIN XL) 150 MG 24 hr tablet Take 1 tablet (150 mg total) by mouth daily.   emtricitabine-tenofovir (TRUVADA) 200-300 MG tablet Take 1 tablet by mouth daily.   nicotine (NICODERM CQ - DOSED IN MG/24 HOURS) 14 mg/24hr patch 14 mg daily.   predniSONE (DELTASONE) 10 MG tablet Take six tablets on day 1, five tablets on day 2, four tablets on day 3, three tablets on day 4, two tablets on day 5, and one tablet on day 6.   Vitamin D, Ergocalciferol, (DRISDOL) 1.25 MG (50000 UNIT) CAPS capsule Take 50,000 Units by mouth once a week.   No facility-administered encounter medications on file as of 06/15/2023.      Medical History: Past Medical History:  Diagnosis Date   Anxiety    Depression      Vital Signs: BP 118/73 (BP  Location: Left Arm, Patient Position: Sitting, Cuff Size: Normal)   Pulse 85   Temp (!) 97 F (36.1 C)   Resp 18   SpO2 99%    ROS negative unless otherwise indicated above  Physical Exam Skin:           Assessment/Plan:  1. Contact dermatitis due to poison ivy - predniSONE (DELTASONE) 10 MG tablet; Take six tablets on day 1, five tablets on day 2, four tablets on day 3, three tablets on day 4, two tablets on day 5, and one tablet on day 6.  Dispense: 21 tablet; Refill: 0    AVS Instructions attached: Follow these instructions at home: Medicines Take or apply over-the-counter and prescription medicines only as told by your health care provider. Use hydrocortisone cream or calamine lotion as needed to soothe the skin and relieve itching. General instructions Do not scratch or rub your skin. Apply a cold, wet cloth (cold compress) to the affected areas or take baths in cool water. This will help with itching. Avoid hot baths and showers. Take oatmeal baths as needed. Use colloidal oatmeal. You can get this at your local pharmacy or grocery store. Follow the instructions on the packaging. Wash all clothes, bedsheets, towels, and blankets you were in contact with between your exposure and appearance of the rash. Check the affected area every day for signs of infection. Check for: More redness, swelling, or  pain. Fluid or blood. Warmth. Pus or a bad smell. Keep all follow-up visits. Your health care provider may want to see how your skin is progressing with treatment. How is this prevented?  Learn to identify the poison ivy plant and avoid contact with the plant. This plant can be recognized by the number of leaves. Generally, poison ivy has three leaves with flowering branches on a single stem. The leaves are typically glossy, and they have jagged edges that come to a point. If you have been exposed to poison ivy, thoroughly wash with soap and water right away. You have about  30 minutes to remove the plant resin before it will cause the rash. Be sure to wash under your fingernails, because any plant resin there will continue to spread the rash. When hiking or camping, wear clothes that will help you to avoid skin exposure. This includes long pants, a long-sleeved shirt, long socks, and hiking boots. You can also apply preventive lotion to your skin to help limit exposure. If you suspect that your clothes or outdoor gear came in contact with poison ivy, rinse them off outside with a garden hose before you bring them inside your house. When doing yard work or gardening, wear gloves, long sleeves, long pants, and boots. Wash your garden tools and gloves if they come in contact with poison ivy. If you suspect that your pet has come into contact with poison ivy, wash them with pet shampoo and water. Make sure to wear gloves while washing your pet. Contact a health care provider if: You have open sores in the rash area. You have any signs of infection. You have redness that spreads beyond the rash area. You have a fever. You have a rash over a large area of your body. You have a rash on your eyes, mouth, or genitals. You have a rash that does not improve after a few weeks. Get help right away if: Your face swells or your eyes swell shut. You have trouble breathing. You have trouble swallowing. These symptoms may be an emergency. Get help right away. Call 911. Do not wait to see if the symptoms will go away. Do not drive yourself to the hospital.  General Counseling: Ronnie Hester understanding of the findings of todays visit and agrees with plan of treatment. I have discussed any further diagnostic evaluation that may be needed or ordered today. We also reviewed his medications today. he has been encouraged to call the office with any questions or concerns that should arise related to todays visit.   No orders of the defined types were placed in this  encounter.   Meds ordered this encounter  Medications   predniSONE (DELTASONE) 10 MG tablet    Sig: Take six tablets on day 1, five tablets on day 2, four tablets on day 3, three tablets on day 4, two tablets on day 5, and one tablet on day 6.    Dispense:  21 tablet    Refill:  0    Order Specific Question:   Supervising Provider    Answer:   Noralee Stain [[id number]]       Marisue Ivan, PA-C

## 2023-06-15 NOTE — Progress Notes (Signed)
Hydrocortisone given to get him through today due to itching still with calamine lotion on.

## 2023-06-19 ENCOUNTER — Encounter: Payer: Self-pay | Admitting: Family Medicine

## 2023-06-26 ENCOUNTER — Ambulatory Visit: Payer: No Typology Code available for payment source | Admitting: Occupational Medicine

## 2023-06-26 VITALS — BP 113/67 | HR 74 | Temp 97.1°F

## 2023-06-26 DIAGNOSIS — L03012 Cellulitis of left finger: Secondary | ICD-10-CM

## 2023-06-26 NOTE — Progress Notes (Signed)
Patient reports on June 29 th -June 30 blister on the cuticle of left mid finger. It throb for the entire night. He reports draining blister with needle on Sunday. Filled with yellow pus. Its been a week its healing and improved but still really painful and swollen. Noted swelling. No redness or warmth. Noted scant  dry yellowish green drainage at base of cuticle. Will notify NP. Scheduled appt in the am. No fevers. Patient prefers to keep clean and dry. States its worse pain when wearing neosporin and Band-Aid.

## 2023-06-27 ENCOUNTER — Ambulatory Visit: Payer: No Typology Code available for payment source | Admitting: Registered Nurse

## 2023-06-27 ENCOUNTER — Encounter: Payer: Self-pay | Admitting: Registered Nurse

## 2023-06-27 VITALS — BP 115/76 | HR 78 | Temp 97.0°F | Resp 18

## 2023-06-27 DIAGNOSIS — L03012 Cellulitis of left finger: Secondary | ICD-10-CM

## 2023-06-27 MED ORDER — AMOXICILLIN 875 MG PO TABS: 875.0000 mg | ORAL_TABLET | Freq: Two times a day (BID) | ORAL | 0 refills | Status: AC

## 2023-06-27 MED ORDER — TRIPLE ANTIBIOTIC 3.5-400-5000 EX OINT: 1.0000 | TOPICAL_OINTMENT | Freq: Two times a day (BID) | CUTANEOUS | 0 refills | Status: AC | PRN

## 2023-06-27 NOTE — Patient Instructions (Signed)

## 2023-06-27 NOTE — Progress Notes (Signed)
Subjective:    Patient ID: Ronnie Hester, male    DOB: May 04, 1986, 37 y.o.   MRN: [id number]  36y/o caucasian established male here for evaluation infection left middle finger started 29 Jun has been swelling expanding and becoming more painful/throbbing noted 7 Jul blister filled with pus popped it and it drained.  Pain and swelling improved today but still having honey colored discharge from nail bed and nail has become loose.  Less pain when finger touches something today currently not throbbing reports pain level 2/10 now.  Washes with soap and water daily hasn't been covering with bandaid because skin becomes wet and painful.  Saw RN Kimrey yesterday and started applying triple antibiotic  Saw PCM started on Wellbutrin for smoking cessation and has been continuing transdermal nicotine patches and doing well not smoking.  Has not used his transdermal refill yet. Denied headache/visual changes/chest pain/palpitations.  Denied known injury or trauma     Review of Systems  Constitutional:  Negative for chills and fever.  HENT:  Negative for trouble swallowing and voice change.   Eyes:  Negative for photophobia and visual disturbance.  Respiratory:  Negative for cough.   Gastrointestinal:  Negative for diarrhea, nausea and vomiting.  Genitourinary:  Negative for difficulty urinating.  Musculoskeletal:  Negative for gait problem, joint swelling, myalgias, neck pain and neck stiffness.  Skin:  Positive for color change and rash. Negative for pallor.  Neurological:  Negative for dizziness, tremors, weakness and numbness.  Psychiatric/Behavioral:  Negative for agitation, confusion and sleep disturbance.        Objective:   Physical Exam Vitals and nursing note reviewed.  Constitutional:      General: He is awake. He is not in acute distress.    Appearance: Normal appearance. He is well-developed, well-groomed and normal weight. He is not ill-appearing, toxic-appearing or  diaphoretic.  HENT:     Head: Normocephalic and atraumatic.     Jaw: There is normal jaw occlusion.     Salivary Glands: Right salivary gland is not diffusely enlarged. Left salivary gland is not diffusely enlarged.     Right Ear: Hearing and external ear normal.     Left Ear: Hearing and external ear normal.     Nose: Nose normal. No congestion or rhinorrhea.     Mouth/Throat:     Lips: Pink. No lesions.     Mouth: Mucous membranes are moist.     Pharynx: Oropharynx is clear.  Eyes:     General: Lids are normal. Vision grossly intact. Gaze aligned appropriately. No scleral icterus.       Right eye: No discharge.        Left eye: No discharge.     Extraocular Movements: Extraocular movements intact.     Conjunctiva/sclera: Conjunctivae normal.     Pupils: Pupils are equal, round, and reactive to light.  Neck:     Trachea: Trachea normal.  Cardiovascular:     Rate and Rhythm: Normal rate and regular rhythm.     Pulses: Normal pulses.          Radial pulses are 2+ on the right side and 2+ on the left side.  Pulmonary:     Effort: Pulmonary effort is normal.     Breath sounds: Normal breath sounds and air entry. No stridor or transmitted upper airway sounds. No wheezing.     Comments: Spoke full sentences without difficulty; no cough observed in exam room Abdominal:     General: Abdomen  is flat.  Musculoskeletal:        General: Swelling and tenderness present. Normal range of motion.     Right hand: No swelling, lacerations, tenderness or bony tenderness. Normal range of motion. Normal strength. Normal capillary refill.     Left hand: Swelling and tenderness present. No lacerations or bony tenderness. Normal range of motion. Normal strength. Normal sensation. Normal capillary refill.       Hands:     Cervical back: Normal range of motion and neck supple. No rigidity.     Comments: Nonpitting 1+/4 edema DIP left 3rd digit skin scaling gross macular erythema and nailbed with honey  crusted discharge; TTP mildly full arom MCP, DIP and PIP joints;   Lymphadenopathy:     Head:     Right side of head: No submandibular or preauricular adenopathy.     Left side of head: No submandibular or preauricular adenopathy.     Cervical: No cervical adenopathy.     Right cervical: No superficial cervical adenopathy.    Left cervical: No superficial cervical adenopathy.  Skin:    General: Skin is warm and dry.     Capillary Refill: Capillary refill takes less than 2 seconds.     Coloration: Skin is not ashen, cyanotic, jaundiced, mottled, pale or sallow.     Findings: Erythema and rash present. No abrasion, abscess, acne, bruising, burn, ecchymosis, signs of injury, laceration, lesion, petechiae or wound. Rash is crusting, macular, pustular and scaling. Rash is not nodular, papular, purpuric, urticarial or vesicular.     Nails: There is no clubbing.     Comments: Left 3rd digit distally circumferential distal scaling, macular erythema; crusting and pustular discharge from nail bed/honey crusted along entire nail bed  Neurological:     General: No focal deficit present.     Mental Status: He is alert and oriented to person, place, and time. Mental status is at baseline.     GCS: GCS eye subscore is 4. GCS verbal subscore is 5. GCS motor subscore is 6.     Cranial Nerves: Cranial nerves 2-12 are intact. No cranial nerve deficit, dysarthria or facial asymmetry.     Sensory: Sensation is intact.     Motor: Motor function is intact. No weakness, tremor, atrophy, abnormal muscle tone or seizure activity.     Coordination: Coordination is intact. Coordination normal.     Gait: Gait is intact. Gait normal.     Comments: In/out of chair without difficulty; gait sure and steady in clinic; bilateral hand grasp equal 5/5  Psychiatric:        Attention and Perception: Attention and perception normal.        Mood and Affect: Mood and affect normal.        Speech: Speech normal.        Behavior:  Behavior normal. Behavior is cooperative.        Thought Content: Thought content normal.        Cognition and Memory: Cognition and memory normal.        Judgment: Judgment normal.          Assessment & Plan:   A-celluitis finger left third initial visit, tobacco cessation  P-dispensed 20 tabs amoxicillin 875mg  po BID x 7 days #20 RF0 from [facility] to patient.  Exitcare handout on skin infection. RTC if worsening erythema, pain, purulent discharge, fever. Wash towels, washcloths, sheets in hot water with bleach every couple of days until infection resolved. Wash area with soap  and water at least daily.  Discussed to protect broken skin from further abrasion in gloves trial of telfa with cobain today since bandaids causing maceration of skin and more discomfort.  Apply triple antibiotic daily to BID with dressing change.  Cover with bandage until healed over.  Given telfa gauze/cobain and triple antibiotic ointment from clinic stock. Discussed nail may become detached/fall off since infection at nail bed.  Distal portion nail is attached at this time.  Patient denied seizure history.  Discussed amoxicillin may raise wellbutrin blood levels consider holding dose if feeling unwell.  Patient verbalized understanding, agreed with plan of care and had no further questions at this time.    Wellbutrin and nicotine transdermal working well plan to step down transdermal dose in 2-3 weeks.started use 20 May 2023.Wellbutrin managed by PCM.  Poison ivy has resolved.

## 2023-06-30 ENCOUNTER — Other Ambulatory Visit: Payer: Self-pay | Admitting: Family Medicine

## 2023-06-30 DIAGNOSIS — Z2981 Encounter for HIV pre-exposure prophylaxis: Secondary | ICD-10-CM

## 2023-07-21 NOTE — Telephone Encounter (Signed)
Poison ivy rash resolved; patient followed up with PCM denied further questions or concerns when seen in clinic this week

## 2023-07-26 ENCOUNTER — Encounter: Payer: Self-pay | Admitting: Registered Nurse

## 2023-07-26 DIAGNOSIS — R7989 Other specified abnormal findings of blood chemistry: Secondary | ICD-10-CM

## 2023-07-27 MED ORDER — VITAMIN D3 50 MCG (2000 UT) PO CAPS: 2000.0000 [IU] | ORAL_CAPSULE | Freq: Every day | ORAL | 3 refills | Status: DC

## 2023-07-27 NOTE — Telephone Encounter (Signed)
Patient reported he prefers to have Rx for 2000 units daily vitamin D electronic Rx sent to his pharmacy of choice today #90 RF3

## 2023-07-28 ENCOUNTER — Encounter: Payer: Self-pay | Admitting: Family Medicine

## 2023-07-28 DIAGNOSIS — F17200 Nicotine dependence, unspecified, uncomplicated: Secondary | ICD-10-CM

## 2023-07-28 DIAGNOSIS — F419 Anxiety disorder, unspecified: Secondary | ICD-10-CM

## 2023-07-28 MED ORDER — BUPROPION HCL ER (XL) 150 MG PO TB24
150.0000 mg | ORAL_TABLET | Freq: Every day | ORAL | 2 refills | Status: DC
Start: 2023-07-28 — End: 2023-08-30

## 2023-08-03 ENCOUNTER — Encounter: Payer: Self-pay | Admitting: Registered Nurse

## 2023-08-03 ENCOUNTER — Encounter (INDEPENDENT_AMBULATORY_CARE_PROVIDER_SITE_OTHER): Payer: Self-pay

## 2023-08-03 ENCOUNTER — Ambulatory Visit: Payer: Self-pay | Admitting: Registered Nurse

## 2023-08-03 VITALS — BP 143/83 | HR 65

## 2023-08-03 DIAGNOSIS — F17210 Nicotine dependence, cigarettes, uncomplicated: Secondary | ICD-10-CM

## 2023-08-03 MED ORDER — NICOTINE 7 MG/24HR TD PT24: 7.0000 mg | MEDICATED_PATCH | Freq: Every day | TRANSDERMAL | 0 refills | Status: DC

## 2023-08-03 NOTE — Progress Notes (Signed)
Subjective:    Patient ID: Ronnie Hester, male    DOB: [birth year], 37 y.o.   MRN: [id number]  37y/o caucasian male establishedblished patient here for refill nicotine transdermal.  Stated it does help with cravings.  He has noticed when he does have craving patch has fallen off and he didn't notice it had fallen off overnight or after heavy sweating.  Denied headache, visual changes or dyspnea.      Review of Systems  Constitutional:  Negative for chills and fever.  Eyes:  Negative for photophobia and visual disturbance.  Respiratory:  Negative for shortness of breath, wheezing and stridor.   Cardiovascular:  Negative for chest pain.  Gastrointestinal:  Negative for diarrhea, nausea and vomiting.  Genitourinary:  Negative for difficulty urinating.  Musculoskeletal:  Negative for gait problem, neck pain and neck stiffness.  Skin:  Negative for rash.  Neurological:  Negative for dizziness, facial asymmetry, light-headedness and headaches.  Psychiatric/Behavioral:  Negative for agitation, confusion and sleep disturbance.        Objective:   Physical Exam Vitals and nursing note reviewed.  Constitutional:      General: He is awake. He is not in acute distress.    Appearance: Normal appearance. He is well-developed, well-groomed and normal weight. He is not ill-appearing, toxic-appearing or diaphoretic.  HENT:     Head: Normocephalic and atraumatic.     Jaw: There is normal jaw occlusion.     Salivary Glands: Right salivary gland is not diffusely enlarged. Left salivary gland is not diffusely enlarged.     Right Ear: Hearing and external ear normal.     Left Ear: Hearing and external ear normal.     Nose: Nose normal. No congestion or rhinorrhea.     Mouth/Throat:     Lips: Pink. No lesions.     Mouth: Mucous membranes are moist.     Pharynx: Oropharynx is clear.  Eyes:     General: Lids are normal. Vision grossly intact. Gaze aligned appropriately. No scleral icterus.        Right eye: No discharge.        Left eye: No discharge.     Extraocular Movements: Extraocular movements intact.     Conjunctiva/sclera: Conjunctivae normal.     Pupils: Pupils are equal, round, and reactive to light.  Neck:     Trachea: Trachea normal.  Cardiovascular:     Rate and Rhythm: Normal rate and regular rhythm.     Pulses: Normal pulses.  Pulmonary:     Effort: Pulmonary effort is normal.     Breath sounds: Normal breath sounds and air entry. No stridor or transmitted upper airway sounds. No wheezing.     Comments: Spoke full sentences without difficulty; no cough observed in clinic Abdominal:     General: Abdomen is flat.  Musculoskeletal:        General: Normal range of motion.     Right hand: Normal strength. Normal capillary refill.     Left hand: Normal strength. Normal capillary refill.     Cervical back: Normal range of motion and neck supple. No swelling, edema, deformity, erythema, signs of trauma, lacerations, rigidity, torticollis or crepitus. No pain with movement. Normal range of motion.     Thoracic back: No swelling, edema, deformity or signs of trauma. Normal range of motion.     Right lower leg: No edema.     Left lower leg: No edema.  Lymphadenopathy:     Head:  Right side of head: No submandibular or preauricular adenopathy.     Left side of head: No submandibular or preauricular adenopathy.     Cervical: No cervical adenopathy.     Right cervical: No superficial cervical adenopathy.    Left cervical: No superficial cervical adenopathy.  Skin:    General: Skin is warm and dry.     Capillary Refill: Capillary refill takes less than 2 seconds.     Coloration: Skin is not ashen, cyanotic, jaundiced, mottled, pale or sallow.     Findings: No abrasion, abscess, acne, bruising, burn, ecchymosis, erythema, signs of injury, laceration, lesion, petechiae, rash or wound.     Nails: There is no clubbing.  Neurological:     General: No focal deficit present.      Mental Status: He is alert and oriented to person, place, and time. Mental status is at baseline.     GCS: GCS eye subscore is 4. GCS verbal subscore is 5. GCS motor subscore is 6.     Cranial Nerves: No cranial nerve deficit, dysarthria or facial asymmetry.     Motor: Motor function is intact. No weakness, tremor, atrophy, abnormal muscle tone or seizure activity.     Coordination: Coordination is intact. Coordination normal.     Gait: Gait is intact. Gait normal.     Comments: In/out of chair without difficulty; gait sure and steady in clinic; bilateral hand grasp equal 5/5  Psychiatric:        Attention and Perception: Attention and perception normal.        Mood and Affect: Mood and affect normal.        Speech: Speech normal.        Behavior: Behavior normal. Behavior is cooperative.        Thought Content: Thought content normal.        Cognition and Memory: Cognition and memory normal.        Judgment: Judgment normal.           Assessment & Plan:   A-cigarette nicotine dependence without complication, elevated blood pressure  P-Keep follow up visits with PCM for wellbutrin as unable to prescribe in this clinic due to contract limitations.  Discussed with patient has had 8 weeks on dose and guidelines recommend step down to 16mcg/day x 2 weeks then stop electronic Rx #28 RF0 sent to his pharmacy of choice.  Exitcare video on tips for smoking cessation.  Follow up in 4 weeks consider nicotine gum if occasional cravings after he finishes 48mcg/day patches apply 1 daily and remove previous.  Patient agreed with plan of care and had no further questions at this time.  Patient at work at times strenuous activity versus side effect of transdermal nicotine patch will continue to monitor.  Denied headache/chest pain/visual changes.  Seek same day re-evaluation if worst headache of life, chest pain, trouble breathing or visual changes.  Keep added salt in diet less than 2000mg  per  24 hours, caffeine intake less than 200mg  per day.  Exitcare handout preventing hypertension.

## 2023-08-03 NOTE — Patient Instructions (Addendum)
Healthy Living: Tips to Quit Tobacco There are many benefits of not using tobacco. You will learn tips you can use to stop using tobacco. To view the content, go to this web address: https://pe.elsevier.com/[email or web address]  This video will expire on: 02/15/2025. If you need access to this video following this date, please reach out to the healthcare provider who assigned it to you.  Preventing Hypertension Hypertension, also called high blood pressure, is when the force of blood pumping through the arteries is too strong. Arteries are blood vessels that carry blood from the heart throughout the body. Often, hypertension does not cause symptoms until blood pressure is very high. It is important to have your blood pressure checked regularly. Diet and lifestyle changes can help you prevent hypertension, and they may make you feel better overall and improve your quality of life. If you already have hypertension, you may control it with diet and lifestyle changes, as well as with medicine. How can this condition affect me? Over time, hypertension can damage the arteries and decrease blood flow to important parts of the body, including the brain, heart, and kidneys. By keeping your blood pressure in a healthy range, you can help prevent complications like heart attack, heart failure, stroke, kidney failure, and vascular dementia. What can increase my risk? An unhealthy diet and a lack of physical activity can make you more likely to develop high blood pressure. Some other risk factors include: Age. The risk increases with age. Having family members who have had high blood pressure. Having certain health conditions, such as thyroid problems. Being overweight or obese. Drinking too much alcohol or caffeine. Having too much fat, sugar, calories, or salt (sodium) in your diet. Smoking or using illegal drugs. Taking certain medicines, such as antidepressants, decongestants, birth control pills, and NSAIDs, such  as ibuprofen. What actions can I take to prevent or manage this condition? Work with your health care provider to make a hypertension prevention plan that works for you. You may be referred for counseling on a healthy diet and physical activity. Follow your plan and keep all follow-up visits. Diet changes Maintain a healthy diet. This includes: Eating less salt (sodium). Ask your health care provider how much sodium is safe for you to have. The general recommendation is to have less than 1 tsp (2,300 mg) of sodium a day. Do not add salt to your food. Choose low-sodium options when grocery shopping and eating out. Limiting fats in your diet. You can do this by eating low-fat or fat-free dairy products and by eating less red meat. Eating more fruits, vegetables, and whole grains. Make a goal to eat: 1-2 cups of fresh fruits and vegetables each day. 3-4 servings of whole grains each day. Avoiding foods and beverages that have added sugars. Eating fish that contain healthy fats (omega-3 fatty acids), such as mackerel or salmon. If you need help putting together a healthy eating plan, try the DASH diet. This diet is high in fruits, vegetables, and whole grains. It is low in sodium, red meat, and added sugars. DASH stands for Dietary Approaches to Stop Hypertension. Lifestyle changes  Lose weight if you are overweight. Losing just 3-5% of your body weight can help prevent or control hypertension. For example, if your present weight is 200 lb (91 kg), a loss of 3-5% of your weight means losing 6-10 lb (2.7-4.5 kg). Ask your health care provider to help you with a diet and exercise plan to safely lose weight. Get enough exercise. Do  at least 150 minutes of moderate-intensity exercise each week. You could do this in short exercise sessions several times a day, or you could do longer exercise sessions a few times a week. For example, you could take a brisk 10-minute walk or bike ride, 3 times a day, for 5  days a week. Find ways to reduce stress, such as exercising, meditating, listening to music, or taking a yoga class. If you need help reducing stress, ask your health care provider. Do not use any products that contain nicotine or tobacco. These products include cigarettes, chewing tobacco, and vaping devices, such as e-cigarettes. Chemicals in tobacco and nicotine products raise your blood pressure each time you use them. If you need help quitting, ask your health care provider. Learn how to check your blood pressure at home. Make sure that you know your personal target blood pressure, as told by your health care provider. Try to sleep 7-9 hours per night. Alcohol use Do not drink alcohol if: Your health care provider tells you not to drink. You are pregnant, may be pregnant, or are planning to become pregnant. If you drink alcohol: Limit how much you have to: 0-1 drink a day for women. 0-2 drinks a day for men. Know how much alcohol is in your drink. In the [place] one drink equals one 12 oz bottle of beer (355 mL), one 5 oz glass of wine (148 mL), or one 1 oz glass of hard liquor (44 mL). Medicines In addition to diet and lifestyle changes, your health care provider may recommend medicines to help lower your blood pressure. In general: You may need to try a few different medicines to find what works best for you. You may need to take more than one medicine. Take over-the-counter and prescription medicines only as told by your health care provider. Questions to ask your health care provider What is my blood pressure goal? How can I lower my risk for high blood pressure? How should I monitor my blood pressure at home? Where to find support Your health care provider can help you prevent hypertension and help you keep your blood pressure at a healthy level. Your local hospital or your community may also provide support services and prevention programs. The American Heart Association offers an  online support network at supportnetwork.heart.org Where to find more information Learn more about hypertension from: National Heart, Lung, and Blood Institute: PopSteam.is Centers for Disease Control and Prevention: FootballExhibition.com.br American Academy of Family Physicians: familydoctor.org Learn more about the DASH diet from: National Heart, Lung, and Blood Institute: PopSteam.is Contact a health care provider if: You think you are having a reaction to medicines you have taken. You have recurrent headaches or feel dizzy. You have swelling in your ankles. You have trouble with your vision. Get help right away if: You have sudden, severe chest, back, or abdominal pain or discomfort. You have shortness of breath. You have a sudden, severe headache. These symptoms may be an emergency. Get help right away. Call 911. Do not wait to see if the symptoms will go away. Do not drive yourself to the hospital. Summary Hypertension often does not cause any symptoms until blood pressure is very high. It is important to get your blood pressure checked regularly. Diet and lifestyle changes are important steps in preventing hypertension. By keeping your blood pressure in a healthy range, you may prevent complications like heart attack, heart failure, stroke, and kidney failure. Work with your health care provider to make a hypertension  prevention plan that works for you. This information is not intended to replace advice given to you by your health care provider. Make sure you discuss any questions you have with your health care provider. Document Revised: 09/23/2021 Document Reviewed: 09/23/2021 Elsevier Patient Education  2024 [employer].  This information is not intended to replace advice given to you by your health care provider. Make sure you discuss any questions you have with your health care provider. Elsevier Patient Education  2024 [employer].

## 2023-08-04 ENCOUNTER — Encounter: Payer: Self-pay | Admitting: Registered Nurse

## 2023-08-04 DIAGNOSIS — F17210 Nicotine dependence, cigarettes, uncomplicated: Secondary | ICD-10-CM

## 2023-08-04 MED ORDER — NICOTINE 7 MG/24HR TD PT24: 7.0000 mg | MEDICATED_PATCH | Freq: Every day | TRANSDERMAL | 0 refills | Status: DC

## 2023-08-16 ENCOUNTER — Encounter: Payer: Self-pay | Admitting: Registered Nurse

## 2023-08-16 ENCOUNTER — Telehealth: Payer: Self-pay | Admitting: Registered Nurse

## 2023-08-16 DIAGNOSIS — F17210 Nicotine dependence, cigarettes, uncomplicated: Secondary | ICD-10-CM

## 2023-08-16 NOTE — Telephone Encounter (Signed)
[facility] seen in [facility] stated new nicotine patch dose has been working well for him in fact yesterday didn't wear patch at all and didn't have any cravings.  Patient pleased with how this cigarette smoking cessation has gone.  Reported cravings less and am cough has resolved and food tastes a lot better now.  A&Ox3 spoke full sentences without difficulty respirations even and unlabored skin warm dry and pink; no observed cough/throat clear/nasal congestion or rhinitis.  Patient reported he is feeling well and denied concerns.

## 2023-08-30 ENCOUNTER — Other Ambulatory Visit: Payer: Self-pay | Admitting: Family Medicine

## 2023-08-30 DIAGNOSIS — F419 Anxiety disorder, unspecified: Secondary | ICD-10-CM

## 2023-08-30 DIAGNOSIS — F17200 Nicotine dependence, unspecified, uncomplicated: Secondary | ICD-10-CM

## 2023-09-07 ENCOUNTER — Ambulatory Visit (INDEPENDENT_AMBULATORY_CARE_PROVIDER_SITE_OTHER): Payer: No Typology Code available for payment source | Admitting: Family Medicine

## 2023-09-07 ENCOUNTER — Encounter: Payer: Self-pay | Admitting: Family Medicine

## 2023-09-07 VITALS — BP 100/42 | HR 85 | Temp 98.8°F | Ht 76.0 in | Wt 196.8 lb

## 2023-09-07 DIAGNOSIS — E559 Vitamin D deficiency, unspecified: Secondary | ICD-10-CM | POA: Diagnosis not present

## 2023-09-07 DIAGNOSIS — Z2981 Encounter for HIV pre-exposure prophylaxis: Secondary | ICD-10-CM

## 2023-09-07 DIAGNOSIS — F17200 Nicotine dependence, unspecified, uncomplicated: Secondary | ICD-10-CM

## 2023-09-07 MED ORDER — DOXYCYCLINE HYCLATE 100 MG PO TABS
200.0000 mg | ORAL_TABLET | Freq: Once | ORAL | 5 refills | Status: DC
Start: 2023-09-07 — End: 2024-06-27

## 2023-09-07 NOTE — Progress Notes (Signed)
Established Patient Office Visit  Subjective   Patient ID: Ronnie Hester, male    DOB: 25-Mar-1986  Age: 37 y.o. MRN: [id number]  Chief Complaint  Patient presents with   Medical Management of Chronic Issues    Pt is here for follow up. He reports that he is doing better on the wellbutrin, states it caused a little bit of jitteriness at first, then it went away. States that it is helping reduce cravings. States that he basically quit smoking, he did have a small relapse during a time of  stress but he was able to stop smoking fairly quickly after the relapse.  Pre-exposure prophylaxis-- on truvada daily. He reports no side effects or problems with the medication. He asked about using doxycycline 200 mg for post-exposure prophylaxis as well. We discussed this and discussed the current recommendations (grade 2B recommendation). In this case the benefits would potentially outwiegh the risks.     Current Outpatient Medications  Medication Instructions   buPROPion (WELLBUTRIN XL) 150 mg, Oral, Daily   emtricitabine-tenofovir (TRUVADA) 200-300 MG tablet 1 tablet, Oral, Daily   nicotine (NICODERM CQ - DOSED IN MG/24 HR) 7 mg, Transdermal, Daily, Remove old patch   Vitamin D3 2,000 Units, Oral, Daily, Take with a meal      Review of Systems  All other systems reviewed and are negative.     Objective:     BP (!) 100/42 (BP Location: Right Arm, Patient Position: Sitting, Cuff Size: Normal)   Pulse 85   Temp 98.8 F (37.1 C) (Oral)   Ht 6\' 4"  (1.93 m)   Wt 196 lb 12.8 oz (89.3 kg)   SpO2 98%   BMI 23.96 kg/m    Physical Exam Constitutional:      Appearance: Normal appearance. He is normal weight.  Cardiovascular:     Rate and Rhythm: Normal rate and regular rhythm.     Heart sounds: Normal heart sounds.  Pulmonary:     Effort: Pulmonary effort is normal.     Breath sounds: Normal breath sounds. No wheezing, rhonchi or rales.  Neurological:     General: No focal  deficit present.     Mental Status: He is alert and oriented to person, place, and time. Mental status is at baseline.  Psychiatric:        Mood and Affect: Mood normal.        Behavior: Behavior normal.      The ASCVD Risk score (Arnett DK, et al., 2019) failed to calculate for the following reasons:   The 2019 ASCVD risk score is only valid for ages 28 to 106    Assessment & Plan:  Vitamin D deficiency Assessment & Plan: Needs new level today for surveillance.   Orders: -     VITAMIN D 25 Hydroxy (Vit-D Deficiency, Fractures)  Encounter for HIV pre-exposure prophylaxis Assessment & Plan: Doing well on the truvada, will add doxycycline 200 mg once as needed for post-exposure prophylaxis. Reiflls sent, he needs a hepatic function panel today for medication surveillance.   Orders: -     Hepatic function panel -     Doxycycline Hyclate; Take 2 tablets (200 mg total) by mouth once for 1 dose.  Dispense: 2 tablet; Refill: 5 -     HIV Antibody (routine testing w rflx)  Tobacco dependence Assessment & Plan: Pt doing very well on the wellbutrin, will continue this medication at 150 mg daily,       Return in about  6 months (around 03/06/2024) for follow up.    Karie Georges, MD

## 2023-09-08 LAB — HEPATIC FUNCTION PANEL
ALT: 20 U/L (ref 0–53)
AST: 21 U/L (ref 0–37)
Albumin: 4.6 g/dL (ref 3.5–5.2)
Alkaline Phosphatase: 66 U/L (ref 39–117)
Bilirubin, Direct: 0 mg/dL (ref 0.0–0.3)
Total Bilirubin: 0.4 mg/dL (ref 0.2–1.2)
Total Protein: 7.1 g/dL (ref 6.0–8.3)

## 2023-09-08 LAB — VITAMIN D 25 HYDROXY (VIT D DEFICIENCY, FRACTURES): VITD: 46.74 ng/mL (ref 30.00–100.00)

## 2023-09-08 LAB — HIV ANTIBODY (ROUTINE TESTING W REFLEX): HIV 1&2 Ab, 4th Generation: NONREACTIVE

## 2023-09-11 NOTE — Assessment & Plan Note (Signed)
Needs new level today for surveillance.

## 2023-09-11 NOTE — Assessment & Plan Note (Signed)
Doing well on the truvada, will add doxycycline 200 mg once as needed for post-exposure prophylaxis. Reiflls sent, he needs a hepatic function panel today for medication surveillance.

## 2023-09-11 NOTE — Assessment & Plan Note (Signed)
Pt doing very well on the wellbutrin, will continue this medication at 150 mg daily,

## 2023-10-03 ENCOUNTER — Ambulatory Visit: Payer: Self-pay | Admitting: Registered Nurse

## 2023-10-03 ENCOUNTER — Encounter: Payer: Self-pay | Admitting: Registered Nurse

## 2023-10-03 VITALS — BP 118/77 | HR 64 | Temp 97.4°F | Resp 16

## 2023-10-03 DIAGNOSIS — J00 Acute nasopharyngitis [common cold]: Secondary | ICD-10-CM

## 2023-10-03 DIAGNOSIS — J029 Acute pharyngitis, unspecified: Secondary | ICD-10-CM

## 2023-10-03 DIAGNOSIS — H6993 Unspecified Eustachian tube disorder, bilateral: Secondary | ICD-10-CM

## 2023-10-03 DIAGNOSIS — F17200 Nicotine dependence, unspecified, uncomplicated: Secondary | ICD-10-CM

## 2023-10-03 MED ORDER — SALINE SPRAY 0.65 % NA SOLN: 2.0000 | NASAL | Status: DC

## 2023-10-03 MED ORDER — FLUTICASONE PROPIONATE 50 MCG/ACT NA SUSP: 1.0000 | Freq: Two times a day (BID) | NASAL | Status: DC

## 2023-10-03 MED ORDER — ACETAMINOPHEN 500 MG PO TABS
1000.0000 mg | ORAL_TABLET | Freq: Four times a day (QID) | ORAL | Status: AC | PRN
Start: 2023-10-03 — End: 2023-10-06

## 2023-10-03 NOTE — Patient Instructions (Addendum)
Pharyngitis  Pharyngitis is inflammation of the throat (pharynx). It is a very common cause of sore throat. Pharyngitis can be caused by a bacteria, but it is usually caused by a virus. Most cases of pharyngitis get better on their own without treatment. What are the causes? This condition may be caused by: Infection by viruses (viral). Viral pharyngitis spreads easily from person to person (is contagious) through coughing, sneezing, and sharing of personal items or utensils such as cups, forks, spoons, and toothbrushes. Infection by bacteria (bacterial). Bacterial pharyngitis may be spread by touching the nose or face after coming in contact with the bacteria, or through close contact, such as kissing. Allergies. Allergies can cause buildup of mucus in the throat (post-nasal drip), leading to inflammation and irritation. Allergies can also cause blocked nasal passages, forcing breathing through the mouth, which dries and irritates the throat. What increases the risk? You are more likely to develop this condition if: You are 35-52 years old. You are exposed to crowded environments such as daycare, school, or dormitory living. You live in a cold climate. You have a weakened disease-fighting (immune) system. What are the signs or symptoms? Symptoms of this condition vary by the cause. Common symptoms of this condition include: Sore throat. Fatigue. Low-grade fever. Stuffy nose (nasal congestion) and cough. Headache. Other symptoms may include: Glands in the neck (lymph nodes) that are swollen. Skin rashes. Plaque-like film on the throat or tonsils. This is often a symptom of bacterial pharyngitis. Vomiting. Red, itchy eyes (conjunctivitis). Loss of appetite. Joint pain and muscle aches. Enlarged tonsils. How is this diagnosed? This condition may be diagnosed based on your medical history and a physical exam. Your health care provider will ask you questions about your illness and your  symptoms. A swab of your throat may be done to check for bacteria (rapid strep test). Other lab tests may also be done, depending on the suspected cause, but these are rare. How is this treated? Many times, treatment is not needed for this condition. Pharyngitis usually gets better in 3-4 days without treatment. Bacterial pharyngitis may be treated with antibiotic medicines. Follow these instructions at home: Medicines Take over-the-counter and prescription medicines only as told by your health care provider. If you were prescribed an antibiotic medicine, take it as told by your health care provider. Do not stop taking the antibiotic even if you start to feel better. Use throat sprays to soothe your throat as told by your health care provider. Children can get pharyngitis. Do not give your child aspirin because of the association with Reye's syndrome. Managing pain To help with pain, try: Sipping warm liquids, such as broth, herbal tea, or warm water. Eating or drinking cold or frozen liquids, such as frozen ice pops. Gargling with a mixture of salt and water 3-4 times a day or as needed. To make salt water, completely dissolve -1 tsp (3-6 g) of salt in 1 cup (237 mL) of warm water. Sucking on hard candy or throat lozenges. Putting a cool-mist humidifier in your bedroom at night to moisten the air. Sitting in the bathroom with the door closed for 5-10 minutes while you run hot water in the shower.  General instructions  Do not use any products that contain nicotine or tobacco. These products include cigarettes, chewing tobacco, and vaping devices, such as e-cigarettes. If you need help quitting, ask your health care provider. Rest as told by your health care provider. Drink enough fluid to keep your urine pale yellow.  How is this prevented? To help prevent becoming infected or spreading infection: Wash your hands often with soap and water for at least 20 seconds. If soap and water are not  available, use hand sanitizer. Do not touch your eyes, nose, or mouth with unwashed hands, and wash hands after touching these areas. Do not share cups or eating utensils. Avoid close contact with people who are sick. Contact a health care provider if: You have large, tender lumps in your neck. You have a rash. You cough up green, yellow-brown, or bloody mucus. Get help right away if: Your neck becomes stiff. You drool or are unable to swallow liquids. You cannot drink or take medicines without vomiting. You have severe pain that does not go away, even after you take medicine. You have trouble breathing, and it is not caused by a stuffy nose. You have new pain and swelling in your joints such as the knees, ankles, wrists, or elbows. These symptoms may represent a serious problem that is an emergency. Do not wait to see if the symptoms will go away. Get medical help right away. Call your local emergency services (911 in the [place] Do not drive yourself to the hospital. Summary Pharyngitis is redness, pain, and swelling (inflammation) of the throat (pharynx). While pharyngitis can be caused by a bacteria, the most common causes are viral. Most cases of pharyngitis get better on their own without treatment. Bacterial pharyngitis is treated with antibiotic medicines. This information is not intended to replace advice given to you by your health care provider. Make sure you discuss any questions you have with your health care provider. Document Revised: 03/03/2021 Document Reviewed: 03/03/2021 Elsevier Patient Education  2024 [employer]. How to Perform a Sinus Rinse A sinus rinse is a home treatment that is used to rinse your sinuses with a germ-free (sterile) mixture of salt and water (saline solution). Sinuses are air-filled spaces in your skull that are behind the bones of your face and forehead. They open into your nasal cavity. A sinus rinse can help to clear mucus, dirt, dust, or pollen  from your nasal cavity. You may do a sinus rinse when you have a cold, a virus, nasal allergy symptoms, a sinus infection, or stuffiness in your nose or sinuses. What are the risks? A sinus rinse is generally safe and effective. However, there are a few risks, which include: A burning sensation in your sinuses. This may happen if you do not make the saline solution as directed. Be sure to follow all directions when making the saline solution. Nasal irritation. Infection. This may be from unclean supplies or from contaminated water. Infection from contaminated water is rare, but possible. Do not do a sinus rinse if you have had ear or nasal surgery, ear infection, or plugged ears, unless recommended by your health care provider. Supplies needed: Saline solution or powder. Distilled or sterile water to mix with saline powder. You may use boiled and cooled tap water. Boil tap water for 5 minutes; cool until it is lukewarm. Use within 24 hours. Do not use regular tap water to mix with the saline solution. Neti pot or nasal rinse bottle. These supplies release the saline solution into your nose and through your sinuses. Neti pots and nasal rinse bottles can be purchased at [employer], a health food store, or online. How to perform a sinus rinse  Wash your hands with soap and water for at least 20 seconds. If soap and water are not  available, use hand sanitizer. Wash your device according to the directions that came with the product and then dry it. Use the solution that comes with your product or one that is sold separately in stores. Follow the mixing directions on the package to mix with sterile or distilled water. Fill the device with the amount of saline solution noted in the device instructions. Stand by a sink and tilt your head sideways over the sink. Place the spout of the device in your upper nostril (the one closer to the ceiling). Gently pour or squeeze the saline solution into  your nasal cavity. The liquid should drain out from the lower nostril if you are not too congested. While rinsing, breathe through your open mouth. Gently blow your nose to clear any mucus and rinse solution. Blowing too hard may cause ear pain. Turn your head in the other direction and repeat in your other nostril. Clean and rinse your device with clean water and then air-dry it. Talk with your health care provider or pharmacist if you have questions about how to do a sinus rinse. Summary A sinus rinse is a home treatment that is used to rinse your sinuses with a sterile mixture of salt and water (saline solution). You may do a sinus rinse when you have a cold, a virus, nasal allergy symptoms, a sinus infection, or stuffiness in your nose or sinuses. A sinus rinse is generally safe and effective. Follow all instructions carefully. This information is not intended to replace advice given to you by your health care provider. Make sure you discuss any questions you have with your health care provider. Document Revised: 05/24/2021 Document Reviewed: 05/24/2021 Elsevier Patient Education  2024 [employer]. Allergic Rhinitis, Adult  Allergic rhinitis is an allergic reaction that affects the mucous membrane inside the nose. The mucous membrane is the tissue that produces mucus. There are two types of allergic rhinitis: Seasonal. This type is also called hay fever and happens only during certain seasons. Perennial. This type can happen at any time of the year. Allergic rhinitis cannot be spread from person to person. This condition can be mild, bad, or very bad. It can develop at any age and may be outgrown. What are the causes? This condition is caused by allergens. These are things that can cause an allergic reaction. Allergens may differ for seasonal allergic rhinitis and perennial allergic rhinitis. Seasonal allergic rhinitis is caused by pollen. Pollen can come from grasses, trees, and  weeds. Perennial allergic rhinitis may be caused by: Dust mites. Proteins in a pet's pee (urine), saliva, or dander. Dander is dead skin cells from a pet. Smoke, mold, or car fumes. Remains of or waste from insects such as cockroaches. What increases the risk? You are more likely to develop this condition if you have a family history of allergies or other conditions related to allergies, including: Allergic conjunctivitis. This is irritation and swelling of parts of the eyes and eyelids. Asthma. This condition affects the lungs and makes it hard to breathe. Atopic dermatitis or eczema. This is long term (chronic) irritation and swelling of the skin. Food allergies. What are the signs or symptoms? Symptoms of this condition include: Sneezing or coughing. A stuffy nose (nasal congestion), itchy nose, or nasal discharge. Itchy eyes and tearing of the eyes. A feeling of mucus dripping down the back of your throat (postnasal drip). This may cause a sore throat. Trouble sleeping. Tiredness. Headache. How is this diagnosed? This condition may be diagnosed with  your symptoms, your medical history, and a physical exam. Your health care provider may check for related conditions, such as: Asthma. Pink eye. This is eye swelling and irritation caused by infection (conjunctivitis). Ear infection. Upper respiratory infection. This is an infection in the nose, throat, or upper airways. You may also have tests to find out which allergens cause your symptoms. These may include skin tests or blood tests. How is this treated? There is no cure for this condition, but treatment can help control symptoms. Treatment may include: Taking medicines that block allergy symptoms, such as corticosteroids (anti-inflammatories) and antihistamines. Medicine may be given as a shot, nasal spray, or pill. Avoiding any allergens. Being exposed again and again to tiny amounts of allergens to help you build a defense  against allergens (allergenimmunotherapy). This is done if other treatments have not helped. It may include: Allergy shots. These are injected medicines that have small amounts of an allergen in them. Sublingual immunotherapy. This involves taking small doses of a medicine with an allergen in it under your tongue. If these treatments do not work, your provider may prescribe newer, stronger medicines. Follow these instructions at home: Avoiding allergens Find out what you are allergic to and avoid those allergens. These are some things you can do to help avoid allergens: If you have perennial allergies: Replace carpet with wood, tile, or vinyl flooring. Carpet can trap dander and dust. Do not smoke. Do not allow smoking in your home Change your heating and air conditioning filters at least once a month. If you have seasonal allergies, take these steps during allergy season: Keep windows closed as much as possible. Plan outdoor activities when pollen counts are lowest. Check pollen counts before you plan outdoor activities When coming indoors, change clothing and shower before sitting on furniture or bedding. If you have a pet in the house that produces allergens: Keep the pet out of the bedroom. Vacuum, sweep, and dust regularly. General instructions Take over-the-counter and prescription medicines only as told by your provider. Drink enough fluid to keep your pee pale yellow. Where to find more information American Academy of Allergy, Asthma & Immunology: aaaai.org Contact a health care provider if: You have a fever. You develop a cough that does not go away. You make high-pitched whistling sounds when you breathe, most often when you breathe out (wheeze). Your symptoms slow you down or stop you from doing your normal activities each day. Get help right away if: You have shortness of breath. This symptom may be an emergency. Get help right away. Call 911. Do not wait to see if the  symptoms will go away. Do not drive yourself to the hospital. This information is not intended to replace advice given to you by your health care provider. Make sure you discuss any questions you have with your health care provider. Document Revised: 08/15/2022 Document Reviewed: 08/15/2022 Elsevier Patient Education  2024 [employer] Inc. Nonallergic Rhinitis Nonallergic rhinitis is inflammation of the mucous membrane inside the nose. The mucous membrane is the tissue that produces mucus. This condition is different from having allergic rhinitis, which is an allergy that affects the nose. Allergic rhinitis occurs when the body's defense system, or immune system, reacts to a substance that a person is allergic to (allergen), such as pollen, pet dander, mold, or dust. Nonallergic rhinitis has many similar symptoms, but it is not caused by allergens. Nonallergic rhinitis can be an acute or chronic problem. This means it can be short-term or long-term. What  are the causes? This condition may be caused by many different things. Some common types of nonallergic rhinitis include: Infectious rhinitis. This is usually caused by an infection in the nose, throat, or upper airways (upper respiratory system). Vasomotor rhinitis. This is the most common type. It is caused by too much blood flow through your nose, and makes your nose swell. It is triggered by strong odors, cold air, stress, drinking alcohol, cigarette smoke, or changes in the weather. Occupational rhinitis. This type is caused by triggers in the workplace, such as chemicals, dust, animal dander, or air pollution. Hormonal rhinitis, in male teens and adults. This type is caused by an increase in the hormone estrogen and may happen during pregnancy, puberty, or monthly menstrual periods. Hormonal rhinitis gives you fewer symptoms when estrogen levels drop. Drug-induced rhinitis. Several types of medicines can cause this, such as medicines for high  blood pressure or heart disease, aspirin, or NSAIDs. Nonallergic rhinitis with eosinophilia syndrome (NARES). This type is caused by having too much eosinophil, a type of white blood cell. Other causes include a reaction to eating hot or spicy foods. This does not usually cause long-term symptoms. In some cases, the cause of nonallergic rhinitis is not known. What increases the risk? You are more likely to develop this condition if: You are 42-90 years of age. You are male. People who are male are twice as likely to have this condition. What are the signs or symptoms? Common symptoms of this condition include: Stuffy nose (nasal congestion). Runny nose. A feeling of mucus dripping down the back of your throat (postnasal drip). Trouble sleeping. Tiredness, or fatigue. Other symptoms include: Sneezing. Coughing. Itchy nose. Bloodshot eyes. How is this diagnosed? This type may be diagnosed based on: Your symptoms and medical history. A physical exam. Allergy testing to rule out allergic rhinitis. You may have skin tests or blood tests. Your health care provider may also take a swab of nasal discharge to look for an increased number of eosinophils. This is done to confirm a diagnosis of NARES. How is this treated? Treatment for this condition depends on the cause. No single treatment works for everyone. Work with your provider to find the best treatment for you. Treatment may include: Avoiding the things that trigger your symptoms. Medicines to relieve congestion, such as: Steroid nasal spray. There are many types. You may need to try a few to find out which one works best. Engineer, civil (consulting) medicine. This treats nasal congestion and may be given by mouth or as a nasal spray. These medicines are used only for a short time. Medicines to relieve a runny nose. These may include antihistamine medicines or decongestant nasal sprays. Nasal irrigation. This involves using a salt-water (saline)  spray or saline container called a neti pot. Nasal irrigation helps to clear away mucus and keep your nasal passages moist. Surgery to remove part of your mucous membrane. This is done in severe cases if the condition has not improved after 6-12 months of treatment. Follow these instructions at home: Medicines Take or use over-the-counter and prescription medicines only as told by your provider. Do not stop using your medicine even if you start to feel better. Do not take NSAIDs, such as ibuprofen, or medicines that contain aspirin if they make your symptoms worse. Lifestyle Do not drink alcohol if it makes your symptoms worse. Do not use any products that contain nicotine or tobacco. These products include cigarettes, chewing tobacco, and vaping devices, such as e-cigarettes. If you  need help quitting, ask your provider. Avoid secondhand smoke. General instructions Avoid triggers that make your symptoms worse. Use nasal irrigation as told by your provider. Sleep with the head of your bed raised. This may reduce nasal congestion when you sleep. Drink enough fluid to keep your pee (urine) pale yellow. Contact a health care provider if: You have a fever. Your symptoms are getting worse at home. Your symptoms do not lessen with medicine. You develop new symptoms, especially a headache or nosebleed. Get help right away if: You have difficulty breathing. This symptom may be an emergency. Get help right away. Call 911. Do not wait to see if the symptoms will go away. Do not drive yourself to the hospital. This information is not intended to replace advice given to you by your health care provider. Make sure you discuss any questions you have with your health care provider. Document Revised: 08/09/2022 Document Reviewed: 08/09/2022 Elsevier Patient Education  2024 [employer] Inc. Managing the Challenge of Quitting Smoking Quitting smoking is a physical and mental challenge. You may have  cravings, withdrawal symptoms, and temptation to smoke. Before quitting, work with your health care provider to make a plan that can help you manage quitting. Making a plan before you quit may keep you from smoking when you have the urge to smoke while trying to quit. How to manage lifestyle changes Managing stress Stress can make you want to smoke, and wanting to smoke may cause stress. It is important to find ways to manage your stress. You could try some of the following: Practice relaxation techniques. Breathe slowly and deeply, in through your nose and out through your mouth. Listen to music. Soak in a bath or take a shower. Imagine a peaceful place or vacation. Get some support. Talk with family or friends about your stress. Join a support group. Talk with a counselor or therapist. Get some physical activity. Go for a walk, run, or bike ride. Play a favorite sport. Practice yoga.  Medicines Talk with your health care provider about medicines that might help you deal with cravings and make quitting easier for you. Relationships Social situations can be difficult when you are quitting smoking. To manage this, you can: Avoid parties and other social situations where people might be smoking. Avoid alcohol. Leave right away if you have the urge to smoke. Explain to your family and friends that you are quitting smoking. Ask for support and let them know you might be a bit grumpy. Plan activities where smoking is not an option. General instructions Be aware that many people gain weight after they quit smoking. However, not everyone does. To keep from gaining weight, have a plan in place before you quit, and stick to the plan after you quit. Your plan should include: Eating healthy snacks. When you have a craving, it may help to: Eat popcorn, or try carrots, celery, or other cut vegetables. Chew sugar-free gum. Changing how you eat. Eat small portion sizes at meals. Eat 4-6 small  meals throughout the day instead of 1-2 large meals a day. Be mindful when you eat. You should avoid watching television or doing other things that might distract you as you eat. Exercising regularly. Make time to exercise each day. If you do not have time for a long workout, do short bouts of exercise for 5-10 minutes several times a day. Do some form of strengthening exercise, such as weight lifting. Do some exercise that gets your heart beating and causes you to  breathe deeply, such as walking fast, running, swimming, or biking. This is very important. Drinking plenty of water or other low-calorie or no-calorie drinks. Drink enough fluid to keep your urine pale yellow.  How to recognize withdrawal symptoms Your body and mind may experience discomfort as you try to get used to not having nicotine in your system. These effects are called withdrawal symptoms. They may include: Feeling hungrier than normal. Having trouble concentrating. Feeling irritable or restless. Having trouble sleeping. Feeling depressed. Craving a cigarette. These symptoms may surprise you, but they are normal to have when quitting smoking. To manage withdrawal symptoms: Avoid places, people, and activities that trigger your cravings. Remember why you want to quit. Get plenty of sleep. Avoid coffee and other drinks that contain caffeine. These may worsen some of your symptoms. How to manage cravings Come up with a plan for how to deal with your cravings. The plan should include the following: A definition of the specific situation you want to deal with. An activity or action you will take to replace smoking. A clear idea for how this action will help. The name of someone who could help you with this. Cravings usually last for 5-10 minutes. Consider taking the following actions to help you with your plan to deal with cravings: Keep your mouth busy. Chew sugar-free gum. Suck on hard candies or a straw. Brush your  teeth. Keep your hands and body busy. Change to a different activity right away. Squeeze or play with a ball. Do an activity or a hobby, such as making bead jewelry, practicing needlepoint, or working with wood. Mix up your normal routine. Take a short exercise break. Go for a quick walk, or run up and down stairs. Focus on doing something kind or helpful for someone else. Call a friend or family member to talk during a craving. Join a support group. Contact a quitline. Where to find support To get help or find a support group: Call the [employer]'s Smoking Quitline: 1-800-QUIT-NOW 5144355146) Text QUIT to SmokefreeTXT: 454098 Where to find more information Visit these websites to find more information on quitting smoking: [place] Department of Health and Human Services: www.smokefree.gov American Lung Association: www.freedomfromsmoking.org Centers for Disease Control and Prevention (CDC): [employer] American Heart Association: www.heart.org Contact a health care provider if: You want to change your plan for quitting. The medicines you are taking are not helping. Your eating feels out of control or you cannot sleep. You feel depressed or become very anxious. Summary Quitting smoking is a physical and mental challenge. You will face cravings, withdrawal symptoms, and temptation to smoke again. Preparation can help you as you go through these challenges. Try different techniques to manage stress, handle social situations, and prevent weight gain. You can deal with cravings by keeping your mouth busy (such as by chewing gum), keeping your hands and body busy, calling family or friends, or contacting a quitline for people who want to quit smoking. You can deal with withdrawal symptoms by avoiding places where people smoke, getting plenty of rest, and avoiding drinks that contain caffeine. This information is not intended to replace advice given to you by your health care  provider. Make sure you discuss any questions you have with your health care provider. Document Revised: 11/26/2021 Document Reviewed: 11/26/2021 Elsevier Patient Education  2024 [employer]  Strep Throat, Adult Strep throat is an infection in the throat that is caused by bacteria. It is common during the cold months of the year.  It mostly affects children who are 41-90 years old. However, people of all ages can get it at any time of the year. This infection spreads from person to person (is contagious) through coughing, sneezing, or having close contact. Your health care provider may use other names to describe the infection. When strep throat affects the tonsils, it is called tonsillitis. When it affects the back of the throat, it is called pharyngitis. What are the causes? This condition is caused by the Streptococcus pyogenes bacteria. What increases the risk? You are more likely to develop this condition if: You care for school-age children, or are around school-age children. Children are more likely to get strep throat and may spread it to others. You spend time in crowded places where the infection can spread easily. You have close contact with someone who has strep throat. What are the signs or symptoms? Symptoms of this condition include: Fever or chills. Redness, swelling, or pain in the tonsils or throat. Pain or difficulty when swallowing. White or yellow spots on the tonsils or throat. Tender glands in the neck and under the jaw. Bad smelling breath. Red rash all over the body. This is rare. How is this diagnosed? This condition is diagnosed by tests that check for the presence and the amount of bacteria that cause strep throat. They are: Rapid strep test. Your throat is swabbed and checked for the presence of bacteria. Results are usually ready in minutes. Throat culture test. Your throat is swabbed. The sample is placed in a cup that allows infections to grow. Results are  usually ready in 1 or 2 days. How is this treated? This condition may be treated with: Medicines that kill germs (antibiotics). Medicines that relieve pain or fever. These include: Ibuprofen or acetaminophen. Aspirin, only for people who are over the age of 44. Throat lozenges. Throat sprays. Follow these instructions at home: Medicines  Take over-the-counter and prescription medicines only as told by your health care provider. Take your antibiotic medicine as told by your health care provider. Do not stop taking the antibiotic even if you start to feel better. Eating and drinking  If you have trouble swallowing, try eating soft foods until your sore throat feels better. Drink enough fluid to keep your urine pale yellow. To help relieve pain, you may have: Warm fluids, such as soup and tea. Cold fluids, such as frozen desserts or popsicles. General instructions Gargle with a salt-water mixture 3-4 times a day or as needed. To make a salt-water mixture, completely dissolve -1 tsp (3-6 g) of salt in 1 cup (237 mL) of warm water. Get plenty of rest. Stay home from work or school until you have been taking antibiotics for 24 hours. Do not use any products that contain nicotine or tobacco. These products include cigarettes, chewing tobacco, and vaping devices, such as e-cigarettes. If you need help quitting, ask your health care provider. It is up to you to get your test results. Ask your health care provider, or the department that is doing the test, when your results will be ready. Keep all follow-up visits. This is important. How is this prevented?  Do not share food, drinking cups, or personal items that could cause the infection to spread to other people. Wash your hands often with soap and water for at least 20 seconds. If soap and water are not available, use hand sanitizer. Make sure that all people in your house wash their hands well. Have family members tested if they  have a  sore throat or fever. They may need an antibiotic if they have strep throat. Contact a health care provider if: You have swelling in your neck that keeps getting bigger. You develop a rash, cough, or earache. You cough up a thick mucus that is green, yellow-brown, or bloody. You have pain or discomfort that does not get better with medicine. Your symptoms seem to be getting worse. You have a fever. Get help right away if: You have new symptoms, such as vomiting, severe headache, stiff or painful neck, chest pain, or shortness of breath. You have severe throat pain, drooling, or changes in your voice. You have swelling of the neck, or the skin on the neck becomes red and tender. You have signs of dehydration, such as tiredness (fatigue), dry mouth, and decreased urination. You become increasingly sleepy, or you cannot wake up completely. Your joints become red or painful. These symptoms may represent a serious problem that is an emergency. Do not wait to see if the symptoms will go away. Get medical help right away. Call your local emergency services (911 in the [place] Do not drive yourself to the hospital. Summary Strep throat is an infection in the throat that is caused by the Streptococcus pyogenes bacteria. This infection is spread from person to person (is contagious) through coughing, sneezing, or having close contact. Take your medicines, including antibiotics, as told by your health care provider. Do not stop taking the antibiotic even if you start to feel better. To prevent the spread of germs, wash your hands well with soap and water. Have others do the same. Do not share food, drinking cups, or personal items. Get help right away if you have new symptoms, such as vomiting, severe headache, stiff or painful neck, chest pain, or shortness of breath. This information is not intended to replace advice given to you by your health care provider. Make sure you discuss any questions you have  with your health care provider. Document Revised: 03/30/2021 Document Reviewed: 03/30/2021 Elsevier Patient Education  2024 [employer].

## 2023-10-03 NOTE — Progress Notes (Signed)
Subjective:    Patient ID: Ronnie Hester, male    DOB: 16-Aug-1986, 37 y.o.   MRN: [id number]  36y/o established caucasian male sore throat started on Sunday 13 Oct denied fever/chills/n/v/d/congestion/runny nose.  Worsening no improvement with tylenol, motrin, salt water gargles.  Doesn't like to take antihistamines.  Smoked 2 cigarettes 2 days ago didn't worsen throat pain.  Denied trouble swallowing but has pain with swallowing.  Denied enlarged lymph nodes/dyspnea/shortness of breath/chest pain/loss of taste/smell.  Still taking wellbutrin for smoking cessation but has been off nicotine patches for about a month.  Has fallen off the wagon and smoking a couple cigarettes every few days about 10 per week again.      Review of Systems     Objective:   Physical Exam    Home covid test results negative    Assessment & Plan:   A-acute pharyngitis, bilateral eustachian tube dysfunction, acute rhinitis  P-salt water gargles continue as helping discussed washes away virus/pollen and helps decrease irritation/swelling.  Use OTC medications for runny nose/allergies to dry up drip e.g. claritin/zyrtec 10mg  po daily, nasal saline 2 sprays each nostril q2h prn congestion/thick mucous and  throat lozenges po prn per manufacturer's instructions.  flonase nasal 1 spray each nostril BID OTC.  Patient denied personal or family history of ENT cancer.  OTC antihistamine of choice claritin/zyrtec 10mg  po daily.  Avoid triggers if possible.  Shower prior to bedtime if exposed to triggers.  If allergic dust/dust mites recommend mattress/pillow covers/encasements; washing linens, vacuuming, sweeping, dusting weekly.  Call or return to clinic as needed if these symptoms worsen or fail to improve as anticipated.    Patient verbalized understanding of instructions, agreed with plan of care and had no further questions at this time.  P2:  Avoidance and hand washing.    Discussed most likely post  nasal drip from virus or allergies causing sore throat. Smoking may also irritate throat. Usually no specific medical treatment is needed if a virus is causing the sore throat. The throat most often gets better on its own within 5 to 7 days. Antibiotic medicine does not cure viral pharyngitis.  -For acute pharyngitis caused by bacteria, your healthcare provider will prescribe an antibiotic.  -Motrin/Ibuprofen 800mg  by mouth every 8 hours with food OR naproxen/naprosyn/aleve 500mg  by mouth every 12 hours with food prn pain/fever and/or Tylenol 1000mg  by mouth every 6 hours as needed for pain or fever OTC ER/call 911 if drooling, unable to swallow, trouble breathing discussed tonsillar abscess symptoms/hot potato voice. -Do not smoke.  -Avoid secondhand smoke and other air pollutants.  -Use a cool mist humidifier to add moisture to the air.  -Get plenty of rest; sleep 7-8 hours per night -You may want to rest your throat by talking less and eating a diet that is mostly liquid or soft for a day or two.  -Nonprescription throat lozenges and mouthwashes should help relieve the soreness.  -Gargling with warm saltwater and drinking warm liquids may help. (You can make a saltwater solution by adding 1/4 teaspoon of salt to 8 ounces, or 240 mL, of warm water.)  -Change your toothbrush on your last day of antibiotic -Avoid oral intimate contact until antibiotics finished -Wash dishes/silverware/glasses in hot water or diswasher -Do not share glasses/silverware/dishes during meals that touch your lips/mouth -Usually no specific medical treatment is needed if a virus is causing the sore throat. The throat most often gets better on its own within 5 to 7  days. Antibiotic medicine does not cure viral pharyngitis. DDx mononucleosis, strep throat, hand foot and mouth, post nasal drip irritation pharynx/throat -Exitcare handout on pharyngitis acute and strep throat FOLLOW UP with clinic provider if no improvements  in the next 7-10 days or notify NP if white spots in back of throat as may need antibiotics for strep throat. Patient verbalized understanding of instructions and agreed with plan of care.  P2: Hand washing and diet.     No evidence of invasive bacterial infection, non toxic and well hydrated.  I do not see where any further testing or imaging is necessary at this time.   I will suggest supportive care, rest, good hygiene and encourage the patient to take adequate fluids.  The patient is to return to clinic or EMERGENCY ROOM if symptoms worsen or change significantly e.g. ear pain, fever, purulent discharge from ears or bleeding.   Discussed with patient post nasal drip irritates throat/causes swelling blocks eustachian tubes from draining and fluid fills up middle ear.  Bacteria/viruses can grow in fluid and with moving head tube compressed and increases pressure in tube/ear worsening pain.  Studies show will take 30 days for fluid to resolve after post nasal drip controlled with nasal steroid/antihistamine. Antibiotics and steroids do not speed up fluid removal.  Patient verbalized agreement and understanding of treatment plan and had no further questions at this time.   Encouraged patient to stop smoking again will help with throat irritation.  If needs nicotine patches again to help stop to notify me and will send in low dose Rx again for patient to restart.  Continue wellbutrin with PCM.  Exitcare handout on managing the challenges of quitting smoking.  Patient verbalized understanding information, agreed with plan of care and had no further questions at this time.

## 2023-10-07 ENCOUNTER — Telehealth: Payer: Self-pay | Admitting: Registered Nurse

## 2023-10-07 DIAGNOSIS — J029 Acute pharyngitis, unspecified: Secondary | ICD-10-CM

## 2023-10-07 MED ORDER — PENICILLIN V POTASSIUM 500 MG PO TABS
500.0000 mg | ORAL_TABLET | Freq: Two times a day (BID) | ORAL | 0 refills | Status: DC
Start: 2023-10-07 — End: 2024-03-07

## 2023-10-07 NOTE — Telephone Encounter (Signed)
Patient reported today neck lymph nodes enlarged and pain worsening with swallowing and dysphagia today.  Rx sent to his pharmacy of choice penicillin V 500mg  po BID x 10 days #20 RF0. Has taken penicillin without difficulty in the past.  Only taking doxycycline after sex with new partner not a daily medication.  My chart message sent to patient regarding rx and to continue salt water gargles.

## 2023-10-07 NOTE — Telephone Encounter (Signed)
Patient reported sore throat worsening denied white spots trouble swallowing/breathing.  Still using throat lozenges, nasal saline, saline gargles but pain not improving.  Denied vomiting/fever/chills./enlarged lymph nodes.  Discussed with patient may take tylenol 1000mg  po q6h prn pain or ibuprofen 800mg  po q8h prn pain with food.  Patient to perform home covid test after work today.  Will follow up with patient tomorrow for re-evaluation via telephone as clinic closed Friday - Sunday.  Patient to notify NP if white spots in back of throat, fever/chills/vomiting/dyspnea/dysphagia/dysphasia.  Patient agreed with plan of care and had no further questions at this time.

## 2023-10-10 ENCOUNTER — Ambulatory Visit: Payer: Self-pay | Admitting: Registered Nurse

## 2023-10-10 ENCOUNTER — Encounter: Payer: Self-pay | Admitting: Registered Nurse

## 2023-10-10 VITALS — BP 113/64 | HR 86 | Temp 97.2°F | Resp 16

## 2023-10-10 DIAGNOSIS — J020 Streptococcal pharyngitis: Secondary | ICD-10-CM

## 2023-10-10 NOTE — Progress Notes (Signed)
Subjective:    Patient ID: Ronnie Hester, male    DOB: February 16, 1986, 37 y.o.   MRN: [id number]  36y/o caucasian male established patient started pen v k 10/08/23 started to finally feel better yesterday regarding lymph nodes swelling decreasing, throat pain decreasing.  Initially sore throat didn't feel like previous episodes of strep throat until after work 06 Oct 2023.  Feeling almost back to normal today regarding swallowing.  Left anterior lymph node still a little swollen but improved.  Eating regular diet without difficulty today.  Appetite returned yesterday.  This weekend nothing tasted good and appetite decreased also.  Denied vomiting/fever/chills/dysphagia/dysphasia.  Last office visit 10/03/23 home covid test negative      Review of Systems  Constitutional:  Negative for chills, diaphoresis and fever.  HENT:  Positive for sore throat. Negative for congestion, trouble swallowing and voice change.   Eyes:  Negative for pain and discharge.  Respiratory:  Negative for cough, choking, shortness of breath, wheezing and stridor.   Cardiovascular:  Negative for chest pain and leg swelling.  Gastrointestinal:  Negative for abdominal pain, blood in stool, constipation, diarrhea, nausea and vomiting.  Genitourinary:  Negative for difficulty urinating.  Musculoskeletal:  Negative for arthralgias, back pain, gait problem, myalgias, neck pain and neck stiffness.  Skin:  Negative for rash.  Allergic/Immunologic: Positive for environmental allergies. Negative for food allergies.  Neurological:  Negative for dizziness, tremors, seizures, syncope, facial asymmetry, speech difficulty, weakness, light-headedness, numbness and headaches.  Hematological:  Positive for adenopathy. Does not bruise/bleed easily.  Psychiatric/Behavioral:  Negative for agitation and confusion. The patient is not nervous/anxious.        Objective:   Physical Exam Vitals reviewed.  Constitutional:      General:  He is awake. He is not in acute distress.    Appearance: Normal appearance. He is well-developed and well-groomed. He is not ill-appearing, toxic-appearing or diaphoretic.  HENT:     Head: Normocephalic and atraumatic.     Jaw: There is normal jaw occlusion.     Salivary Glands: Right salivary gland is not diffusely enlarged or tender. Left salivary gland is not diffusely enlarged or tender.     Right Ear: Hearing, ear canal and external ear normal. No decreased hearing noted. No drainage. A middle ear effusion is present. There is no impacted cerumen. Tympanic membrane is not erythematous.     Left Ear: Hearing, ear canal and external ear normal. No decreased hearing noted. No drainage. A middle ear effusion is present. There is no impacted cerumen. Tympanic membrane is not erythematous.     Ears:     Comments: Bilateral TMs intact air fluid level clear; no debris noted in auditory canals    Nose: Nose normal. No congestion or rhinorrhea.     Right Turbinates: Not enlarged, swollen or pale.     Left Turbinates: Not enlarged, swollen or pale.     Right Sinus: No maxillary sinus tenderness or frontal sinus tenderness.     Left Sinus: No maxillary sinus tenderness or frontal sinus tenderness.     Mouth/Throat:     Lips: Pink. No lesions.     Mouth: Mucous membranes are moist. No oral lesions or angioedema.     Dentition: No gum lesions.     Tongue: No lesions. Tongue does not deviate from midline.     Palate: No mass and lesions.     Pharynx: Uvula midline. Pharyngeal swelling, posterior oropharyngeal erythema and postnasal drip present. No oropharyngeal  exudate or uvula swelling.     Tonsils: No tonsillar exudate or tonsillar abscesses. 0 on the right. 0 on the left.     Comments: Cobblestoning posterior pharynx; bilateral allergic shiners; left anterior cervical nodes shotty and mildly TTP left only Eyes:     General: Lids are normal. Vision grossly intact. Gaze aligned appropriately.  Allergic shiner present. No scleral icterus.       Right eye: No discharge.        Left eye: No discharge.     Extraocular Movements: Extraocular movements intact.     Conjunctiva/sclera: Conjunctivae normal.     Pupils: Pupils are equal, round, and reactive to light.  Neck:     Trachea: Trachea and phonation normal. No abnormal tracheal secretions.  Cardiovascular:     Rate and Rhythm: Normal rate and regular rhythm.     Pulses: Normal pulses.          Radial pulses are 2+ on the right side and 2+ on the left side.     Heart sounds: Normal heart sounds, S1 normal and S2 normal. Heart sounds not distant. No murmur heard. Pulmonary:     Effort: Pulmonary effort is normal. No respiratory distress.     Breath sounds: Normal breath sounds and air entry. No stridor or transmitted upper airway sounds. No wheezing, rhonchi or rales.     Comments: Spoke full sentences without difficulty; no cough observed in exam room Abdominal:     General: Abdomen is flat.     Palpations: Abdomen is soft.  Musculoskeletal:        General: Normal range of motion.     Right hand: Normal strength. Normal capillary refill.     Left hand: Normal strength. Normal capillary refill.     Cervical back: Normal range of motion and neck supple. No swelling, edema, deformity, erythema, signs of trauma, lacerations, rigidity, spasms, torticollis, tenderness or crepitus. No pain with movement, spinous process tenderness or muscular tenderness. Normal range of motion.     Thoracic back: No swelling, edema, deformity, signs of trauma, lacerations, spasms or tenderness. Normal range of motion.     Right lower leg: No edema.     Left lower leg: No edema.  Lymphadenopathy:     Head:     Right side of head: No submental, submandibular, tonsillar, preauricular, posterior auricular or occipital adenopathy.     Left side of head: Tonsillar adenopathy present. No submental, submandibular, preauricular, posterior auricular or  occipital adenopathy.     Cervical: Cervical adenopathy present.     Right cervical: No superficial, deep or posterior cervical adenopathy.    Left cervical: Superficial cervical adenopathy present. No deep or posterior cervical adenopathy.     Comments: Shotty left anterior cervical/tonsillar mildly TTP  Skin:    General: Skin is warm and dry.     Capillary Refill: Capillary refill takes less than 2 seconds.     Coloration: Skin is not ashen, cyanotic, jaundiced, mottled, pale or sallow.     Findings: No abrasion, abscess, acne, bruising, burn, ecchymosis, erythema, signs of injury, laceration, lesion, petechiae, rash or wound.     Nails: There is no clubbing.  Neurological:     General: No focal deficit present.     Mental Status: He is alert and oriented to person, place, and time. Mental status is at baseline.     GCS: GCS eye subscore is 4. GCS verbal subscore is 5. GCS motor subscore is 6.  Cranial Nerves: Cranial nerves 2-12 are intact. No cranial nerve deficit, dysarthria or facial asymmetry.     Sensory: Sensation is intact.     Motor: Motor function is intact. No weakness, tremor, atrophy, abnormal muscle tone or seizure activity.     Coordination: Coordination is intact. Coordination normal.     Gait: Gait is intact. Gait normal.     Comments: In/out of chair without difficulty; gait sure and steady in clinic; bilateral hand grasp equal 5/5  Psychiatric:        Attention and Perception: Attention and perception normal.        Mood and Affect: Mood and affect normal.        Speech: Speech normal.        Behavior: Behavior normal. Behavior is cooperative.        Thought Content: Thought content normal.        Cognition and Memory: Cognition and memory normal.        Judgment: Judgment normal.           Assessment & Plan:   A-strep throat  P-finish pen v K 500mg  po BID x 10 days dispensed from pharmacy this weekend.  -For acute pharyngitis caused by bacteria, your  healthcare provider will prescribe an antibiotic.  -Motrin/Ibuprofen 800mg  by mouth every 8 hours with food or naproxen/naprosyn/aleve 500mg  by mouth every 12 hours with food prn pain/fever and/or Tylenol 1000mg  by mouth every 6 hours as needed for pain or fever OTC ER/call 911 if drooling, unable to swallow, trouble breathing discussed tonsillar abscess symptoms/hot potato voice. -Do not smoke.  -Avoid secondhand smoke and other air pollutants.  -Use a cool mist humidifier to add moisture to the air.  -Get plenty of rest; sleep 7-8 hours per night -You may want to rest your throat by talking less and eating a diet that is mostly liquid or soft for a day or two.  -Nonprescription throat lozenges and mouthwashes should help relieve the soreness.  -Gargling with warm saltwater and drinking warm liquids may help. (You can make a saltwater solution by adding 1/4 teaspoon of salt to 8 ounces, or 240 mL, of warm water.)  -Change your toothbrush on your last day of antibiotic -Avoid oral intimate contact until antibiotics finished -Wash dishes/silverware/glasses in hot water or diswasher -Do not share glasses/silverware/dishes during meals that touch your lips/mouth -Usually no specific medical treatment is needed if a virus is causing the sore throat. The throat most often gets better on its own within 5 to 7 days. Antibiotic medicine does not cure viral pharyngitis. DDx mononucleosis, strep throat, hand foot and mouth, post nasal drip irritation pharynx/throat -Exitcare handout on strep throat given to patient FOLLOW UP with clinic provider if no resolution/improvements in the next 7-10 days. Patient verbalized understanding of instructions and agreed with plan of care.  P2: Hand washing and diet.

## 2023-10-10 NOTE — Patient Instructions (Signed)

## 2023-10-12 NOTE — Telephone Encounter (Signed)
Patient seen in clinic 10/10/23 symptoms improved with penicillin v 500mg  po BID started over the weekend.

## 2023-10-25 ENCOUNTER — Other Ambulatory Visit: Payer: Self-pay

## 2023-12-22 ENCOUNTER — Telehealth: Payer: Self-pay | Admitting: Registered Nurse

## 2023-12-22 DIAGNOSIS — S61019A Laceration without foreign body of unspecified thumb without damage to nail, initial encounter: Secondary | ICD-10-CM

## 2023-12-22 NOTE — Telephone Encounter (Signed)
 Notified by HR patient had first aid at work today for thumb laceration broken china in box.  Tdap current 2022 per chart review.  Message left for patient to notify clinic staff if worsening pain/swelling/purulent discharge or red streaks.  Exitcare handout on laceration  see my chart message

## 2023-12-26 ENCOUNTER — Encounter: Payer: Self-pay | Admitting: Registered Nurse

## 2023-12-26 ENCOUNTER — Ambulatory Visit: Payer: Self-pay | Admitting: Registered Nurse

## 2023-12-26 VITALS — HR 72 | Temp 98.2°F | Resp 16

## 2023-12-26 DIAGNOSIS — S0006XA Insect bite (nonvenomous) of scalp, initial encounter: Secondary | ICD-10-CM

## 2023-12-26 MED ORDER — DOXYCYCLINE HYCLATE 100 MG PO TABS: 200.0000 mg | ORAL_TABLET | Freq: Once | ORAL | Status: AC

## 2023-12-26 NOTE — Telephone Encounter (Signed)
 Patient seen in clinic today skin cracked no discharge or tenderness debris from work in fissures discussed applying emollient keeping covered until healed given aquaphor UD and fingertip bandages from clinic stock x 4.  Patient to return to clinic if swelling, discharge, pain.  Wash with soap and water daily and prn soiling.  Patient agreed with plan of care and had no further questions at this time.

## 2023-12-26 NOTE — Progress Notes (Signed)
 Subjective:    Patient ID: Ronnie Hester, male    DOB: [birth year], 38 y.o.   MRN: [id number]  38y/o caucasian male established patient here for evaluation tick bite scalp removed in shower last night.  Concerned as he had lyme disease before and it was [place] tick engorged mildly removed easily shampooing hair surprised him since January.  Denied pain/fever/chills/arthralgias/rashes/headache/n/v/d.    Review of Systems  Constitutional:  Negative for chills, diaphoresis, fatigue and fever.  HENT:  Negative for sore throat, trouble swallowing and voice change.   Eyes:  Negative for photophobia and visual disturbance.  Respiratory:  Negative for cough, shortness of breath, wheezing and stridor.   Gastrointestinal:  Negative for diarrhea, nausea and vomiting.  Genitourinary:  Negative for difficulty urinating.  Musculoskeletal:  Negative for arthralgias, gait problem, joint swelling, myalgias, neck pain and neck stiffness.  Skin:  Negative for color change, rash and wound.  Neurological:  Negative for dizziness, tremors, seizures, syncope, facial asymmetry, speech difficulty, weakness, light-headedness, numbness and headaches.  Hematological:  Negative for adenopathy. Does not bruise/bleed easily.  Psychiatric/Behavioral:  Negative for agitation, confusion and sleep disturbance.       Objective:   Physical Exam Vitals reviewed.  Constitutional:      General: He is awake. He is not in acute distress.    Appearance: Normal appearance. He is well-developed, well-groomed and normal weight. He is not ill-appearing, toxic-appearing or diaphoretic.  HENT:     Head: Normocephalic and atraumatic.     Jaw: There is normal jaw occlusion.     Salivary Glands: Right salivary gland is not diffusely enlarged. Left salivary gland is not diffusely enlarged.     Right Ear: Hearing and external ear normal.     Left Ear: Hearing and external ear normal.     Nose: Nose normal. No congestion or  rhinorrhea.     Mouth/Throat:     Lips: Pink. No lesions.     Mouth: Mucous membranes are moist.     Pharynx: Oropharynx is clear.  Eyes:     General: Lids are normal. Vision grossly intact. Gaze aligned appropriately. Allergic shiner present. No scleral icterus.       Right eye: No discharge.        Left eye: No discharge.     Extraocular Movements: Extraocular movements intact.     Conjunctiva/sclera: Conjunctivae normal.     Pupils: Pupils are equal, round, and reactive to light.  Neck:     Trachea: Trachea and phonation normal.  Cardiovascular:     Rate and Rhythm: Normal rate and regular rhythm.     Pulses: Normal pulses.  Pulmonary:     Effort: Pulmonary effort is normal. No respiratory distress.     Breath sounds: Normal breath sounds and air entry. No stridor or transmitted upper airway sounds. No wheezing.     Comments: Spoke full sentences without difficulty; no cough observed in exam room Abdominal:     General: Abdomen is flat.     Palpations: Abdomen is soft.  Musculoskeletal:        General: No swelling or tenderness. Normal range of motion.     Cervical back: Normal range of motion and neck supple. No edema, erythema, signs of trauma, rigidity, torticollis, tenderness or crepitus. No pain with movement or muscular tenderness. Normal range of motion.     Right lower leg: No edema.     Left lower leg: No edema.  Lymphadenopathy:  Head:     Right side of head: No submandibular or preauricular adenopathy.     Left side of head: No submandibular or preauricular adenopathy.     Cervical: No cervical adenopathy.     Right cervical: No superficial cervical adenopathy.    Left cervical: No superficial cervical adenopathy.  Skin:    General: Skin is warm and dry.     Capillary Refill: Capillary refill takes less than 2 seconds.     Coloration: Skin is not ashen, cyanotic, jaundiced, mottled, pale or sallow.     Findings: No abrasion, bruising, burn, erythema, signs of  injury, laceration, lesion, petechiae, rash or wound.  Neurological:     General: No focal deficit present.     Mental Status: He is alert and oriented to person, place, and time. Mental status is at baseline.     Cranial Nerves: No cranial nerve deficit.     Motor: Motor function is intact. No weakness, tremor, abnormal muscle tone or seizure activity.     Coordination: Coordination is intact. Coordination normal.     Gait: Gait is intact. Gait normal.     Comments: In/out of chair without difficulty; gait sure and steady in clinic; bilateral hand grasp equal 5/5  Psychiatric:        Attention and Perception: Attention and perception normal.        Mood and Affect: Mood and affect normal.        Speech: Speech normal.        Behavior: Behavior normal. Behavior is cooperative.        Thought Content: Thought content normal.        Cognition and Memory: Cognition and memory normal.        Judgment: Judgment normal.    Discussed with patient unable to locate attachment site on scalp in clinic today.  No scalp wounds or rashes found.     Assessment & Plan:  A-tick bite of scalp initial encounter  P-Doxycycline  100mg  sig t2 po x 1 now #20 RF0 dispensed from [facility] to patient keep in cool dark place. Discussed may use later this year leftover for prophylaxis if another tick removed.   Avoid scratching and picking at areas ticks removed.  May apply calagel 2% apply BID prn itching and triple antibiotic topical BID or aquaphor/eucerin/vaseline to speed healing.  Wash affected areas daily with soap and water.  May apply ice 15 minutes po TID prn pain/swelling.  Exitcare handouts on tick bite  consider treatment of clothes with permethrin and using DEET on skin that is not covered.  Discussed follow manufacturers instructions for permethrin reapplication typically after a set number of launderings and ensure when applying permethrin to clothes in well ventilated area. Patient verbalized understanding,  agreed with plan of care and had no further questions at this time.

## 2023-12-26 NOTE — Telephone Encounter (Signed)
 Patient seen in clinic today see office note given lyme prophylaxis doxycycline from [facility] today.

## 2023-12-26 NOTE — Patient Instructions (Signed)
Tick Bite Information, Adult  Ticks are insects that draw blood for food. They climb onto people and animals that brush against the leaves and grasses that they live in. They then bite and attach to the skin. Most ticks are harmless, but some ticks may carry germs that can cause disease. These germs are spread to a person through a bite. To lower your risk of getting a disease from a tick bite, make sure you: Take steps to prevent tick bites. Check for ticks after being outdoors where ticks live. Watch for symptoms of disease if a tick attached to you or if you think a tick bit you. How can I prevent tick bites? Take these steps to help prevent tick bites when you go outdoors in an area where ticks live: Before you go outdoors: Wear long sleeves and long pants to protect your skin from ticks. Wear light-colored clothing so you can see ticks easier. Tuck your pant legs into your socks. Apply insect repellent that has DEET (20% or higher), picaridin, or IR3535 in it to the following areas: Any bare skin. Avoid areas around the eyes and mouth. Edges of clothing, like the top of your boots, the bottom of your pant legs, and your sleeve cuffs. Consider applying an insect repellant that contains permethrin. Follow the instructions on the label. Do not apply permethrin directly to the skin. Instead, apply to the following areas: Clothing and shoes. Outdoor gear and tents. When you are outdoors: Avoid walking through areas with long grass. If you are walking on a trail, stay in the middle of the trail so your skin, hair, and clothing do not touch the bushes. Check for ticks on your clothing, hair, and skin often while you are outdoors. Check again before you go inside. When you go indoors: Check your clothing for ticks. Tumble dry clothes in a dryer on high heat for at least 10 minutes. If clothes are damp, additional time may be needed. If clothes require washing, use hot water. Check your gear and  pets. Shower soon after being outdoors. Check your body for ticks. Do a full body check using a mirror. Be sure to check your scalp, neck, armpits, waist, groin, and joint areas. These are the spots where ticks attach themselves most often. What is the best way to remove a tick?  Remove the tick as soon as possible. Removing it can prevent germs from passing to your body. Do not remove the tick with your bare fingers. Do not try to remove a tick with heat, alcohol, petroleum jelly, or fingernail polish. These things can cause the tick to salivate and regurgitate into your bloodstream, increasing your risk of getting a disease. To remove a tick that is crawling on your skin: Go outside and brush the tick off. Use tape or a lint roller. To remove a tick that is attached to your skin: Wash your hands. If you have gloves, put them on. Use a fine-tipped tweezer, curved forceps, or a tick-removal tool to gently grasp the tick as close to your skin and the tick's head as possible. Gently pull with a steady, upward, and even pressure until the tick lets go. While removing the tick: Take care to keep the tick's head attached to its body. Do not twist or jerk the tick. This can make the tick's head or mouth parts break off and stay in your skin. If this happens, try to remove the mouth parts with tweezers. If you cannot remove them, leave   the area alone and let the skin heal. Do not squeeze or crush the tick's body. This could force disease-carrying fluids from the tick into your body. What should I do after removing a tick? Clean the bite area and your hands with soap and water, rubbing alcohol, or an iodine scrub. If an antiseptic cream or ointment is available, put a small amount on the bite area. Wash and disinfect any tools that you used to remove the tick. How should I dispose of a tick? To dispose of a live tick, use one of these methods: Place it in rubbing alcohol. Place it in a sealed bag  or container, and throw it away. Wrap it tightly in tape, and throw it away. Flush it down the toilet. Where to find more information Centers for Disease Control and Prevention: cdc.gov/ticks [place]. Environmental Protection Agency: epa.gov/insect-repellents Contact a health care provider if: You have symptoms of a disease after a tick bite. Symptoms of a tick-borne disease can occur from moments after the tick bites to 30 days after a tick is removed. Symptoms include: Fever or chills. A red rash that makes a circle (bull's-eye rash) in the bite area. Redness and swelling in the bite area. Headache or stiff neck. Muscle, joint, or bone pain. Abnormal tiredness. Numbness in your legs or trouble walking or moving your legs. Tender or swollen lymph glands. Abdominal pain, vomiting, diarrhea, or weight loss. Get help right away if: You are not able to remove a tick. You have muscle weakness or paralysis. Your symptoms get worse or you experience new symptoms. You find an engorged tick on your skin and you are in an area where there is a higher risk of disease from ticks. Summary Ticks may carry germs that can spread to a person through a bite. These germs can cause disease. Wear protective clothing and use insect repellent to prevent tick bites. Follow the instructions on the label. If you find a tick on your body, remove it as soon as possible. If the tick is attached, do not try to remove it with heat, alcohol, petroleum jelly, or fingernail polish. If you have symptoms of a disease after being bitten by a tick, contact a health care provider. This information is not intended to replace advice given to you by your health care provider. Make sure you discuss any questions you have with your health care provider. Document Revised: 03/07/2022 Document Reviewed: 03/07/2022 Elsevier Patient Education  2024 Elsevier Inc.  

## 2024-01-04 ENCOUNTER — Other Ambulatory Visit: Payer: Self-pay | Admitting: Family Medicine

## 2024-01-04 DIAGNOSIS — F17200 Nicotine dependence, unspecified, uncomplicated: Secondary | ICD-10-CM

## 2024-01-04 DIAGNOSIS — F419 Anxiety disorder, unspecified: Secondary | ICD-10-CM

## 2024-01-15 ENCOUNTER — Encounter: Payer: Self-pay | Admitting: Registered Nurse

## 2024-01-15 ENCOUNTER — Telehealth: Payer: Self-pay | Admitting: Registered Nurse

## 2024-01-15 DIAGNOSIS — U071 COVID-19: Secondary | ICD-10-CM

## 2024-01-15 DIAGNOSIS — J069 Acute upper respiratory infection, unspecified: Secondary | ICD-10-CM

## 2024-01-15 MED ORDER — COVID-19 ANTIGEN TEST VI KIT: 1.0000 | PACK | Freq: Every day | 1 refills | Status: DC | PRN

## 2024-01-15 MED ORDER — SALINE SPRAY 0.65 % NA SOLN: 2.0000 | NASAL | Status: DC

## 2024-01-15 MED ORDER — FLUTICASONE PROPIONATE 50 MCG/ACT NA SUSP: 1.0000 | Freq: Two times a day (BID) | NASAL | Status: DC

## 2024-01-15 MED ORDER — MOLNUPIRAVIR EUA 200MG CAPSULE: 4.0000 | ORAL_CAPSULE | Freq: Two times a day (BID) | ORAL | 0 refills | Status: DC

## 2024-01-15 NOTE — Telephone Encounter (Signed)
Patient stated left work per his supervisor instructions did not go to clinic to see RN Chantel as was unaware clinic open today saw message from HR stating closed today open Tues-Thur this week.  Patient stated sore throat started last night.  Woke up with chills shaking body in middle of night.  Has had chronic cough for a couple of weeks.  Temp this am 100.80F.  Nasal sniffing and intermittent dry cough audible during 9 minute telephone call.  Medication reconciliation performed with patient.  Has never had covid antivirals in the past.  01/15/24 Day 0 back pain, fatigue, cough and congestion, fever and sore throat  Did not home covid test prior to work as no tests available at home.  Electronic Rx covid antigen tests sent to his pharmacy of choice #4 RF1 U[facility].   He had already bought a couple OTC prior to my phone call and planned to perform now.  Discussed send me picture of test results.   Patient did not develop symptoms of  trouble breathing, chest pain, nausea, vomiting, or diarrhea.   Patient to contact clinic staff if vomiting after coughing or unable to tolerate po fluids.  Discussed flu and other viral illnesses circulating in community and some causing GI upset.  If GI upset I have recommended clear fluids then bland diet.  Avoid dairy/spicy, fried and large portions of meat while having nausea.  If vomiting hold po intake x 1 hour.  Then sips clear fluids like broths, ginger ale, power ade, gatorade, pedialyte may advance to soft/bland if no vomiting x 24 hours and appetite returned otherwise hydration main focus. Call me at work from home number if symptoms not improved with plan of care  patient to call if high fever, dehydration, marked weakness, fainting, increased abdominal pain, blood in stool or vomit (red or black).     Reviewed possible Covid symptoms including cough, shortness of breath with exertion or at rest, runny nose, congestion, sinus pain/pressure, sore throat, fever/chills, body  aches, fatigue, loss of taste/smell, GI symptoms of nausea/vomiting/diarrhea.  same day/emergent eval/ER precautions of dizziness/syncope, confusion, blue tint to lips/face, severe shortness of breath/difficulty breathing/wheezing.   .  Patient to isolate in own room and if possible use only one bathroom if living with others in home.  Wear mask when out of room to help prevent spread to others in household.  Sanitize high touch surfaces with lysol/chlorox/bleach spray or wipes daily as viruses are known to live on surfaces from 24 hours to days.  Patient does want antivirals.  Patient at higher risk for hospitalization due smoking, dyslipidemia and low vitamin D and immune suppression treatment.  Patient is not up to date on covid vaccines.  Recommend annual booster or to start series 60 days after infection resolution.  Patient is on prescription medications or daily medications. If taking medications epocrates medication interaction checker used to verify if any drug interactions. Notified should not use ibuprofen/advil/nsaids high dose with truvada as can strain kidneys.  Paxlovid use contraindicated with truvada use raises blood levels of medications.    May use tylenol 1000mg  po q6h prn pain/fever.  Discussed how to take molnupiravir 200mg  take 4 tabs by mouth every 12 hours x 5 days. Discussed most common side effects GI upset and bad taste in mouth.  Discussed I recommended not having sex with anyone while sick/testing positive/10 day quarantine as could spread virus to partner.  Discussed with patient I would call again tomorrow to follow up symptoms/see  if questions/concerns.  Exitcare handouts on viral URI sent to patient my chart/email.  Discussed if covid test negative will empirically treat for influenza as A/B circulating in high numbers in the local community along with RSV, metapneumovirus and norovirus.  Patient has dayquil/nyquil at home for cough/cold use follow manufacturer instructions.   May use salt water gargles and nasal saline 2 sprays each nostril q2h prn congestion/sore throat.  Research has shown it helps to prevent hospitalizations and decrease discomfort.   May use flonase nasal 1 spray each nostril BID prn rhinitis.  Dayquil and nyquil per manufacturer instructions.  honey 1 tablespoon every 4 hours is a natural cough suppressant.  Avoid dehydration and drink water to keep urine pale yellow clear and voiding every 2-4 hours while awake.  Patient alert and oriented x3, spoke full sentences without difficulty.  No audible hoarse voice/wheezing/shortness of breath during telephone call.  Discussed with patient can contact NP Inetta Fermo through my [email or web address] when clinic closed if questions or concerns until RN returns to clinic Tues-Thur [phone].   Pt verbalized understanding and agreement with plan of care. No further questions/concerns at this time. Pt reminded to contact clinic with any changes in symptoms or questions/concerns. HR and supervisor team notified patient excused absence today and tomorrow per communicable disease policy  Re-evaluation 01/16/24

## 2024-01-16 DIAGNOSIS — U071 COVID-19: Secondary | ICD-10-CM

## 2024-01-17 ENCOUNTER — Other Ambulatory Visit: Payer: Self-pay

## 2024-01-17 MED ORDER — MOLNUPIRAVIR EUA 200MG CAPSULE
4.0000 | ORAL_CAPSULE | Freq: Two times a day (BID) | ORAL | 0 refills | Status: DC
  Filled 2024-01-17: qty 40, 5d supply, fill #0

## 2024-01-17 MED ORDER — MOLNUPIRAVIR EUA 200MG CAPSULE
4.0000 | ORAL_CAPSULE | Freq: Two times a day (BID) | ORAL | 0 refills | Status: AC
  Filled 2024-01-17: qty 40, 5d supply, fill #0

## 2024-01-17 NOTE — Telephone Encounter (Signed)
"'  I'm about the same as I was Monday afternoon. The chills have finally stopped but thats about the only symptom thats gone away. But my body feels like ive been ran over. My temps been floating around 99 and 100. Sore throat and stuffy nose has been persistent. I've also felt sorta out of it and brain fogged.  I've been able to eat soups here and there but not much more than that other than staying hydrated."  Left message for patient asking if he filled his antiviral and if manufacturer coupon worked for him to lower price.  HR and supervisor notified excused absence extended 01/17/24

## 2024-01-18 NOTE — Telephone Encounter (Signed)
Spoke with patient via telephone stated last fever overnight. None during the daytime today less brain fog today but still feeling fatigued.  Tolerating po intake without difficulty.  Work requires him to stand his shift and he has been in bed all day today does not feel he could make it through entire shift at work tomorrow.  Denied dyspnea, chest pain, vomiting, diarrhea, wheezing.

## 2024-01-21 ENCOUNTER — Encounter: Payer: Self-pay | Admitting: Registered Nurse

## 2024-01-21 NOTE — Telephone Encounter (Signed)
Spoke with patient via telephone stated returned to normal diet had meatball sub and [place] food today.  Did not require any naps.  Had 1 episode of diarrhea this am on awakening but none further during the day.  Symptoms all improved.  Brain fog has lifted 90% along with fatigue.  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  I have recommended avoid large portions meat/dairy, spicy and fried foods until diarrhea resolves. Avoid dehydration drink noncaffeinated beverages (water, ginger ale, soup broth, popsicles, no sugar added gatorade/powerade) to urinate every 2-4 hours pale yellow urine.  It is easy to become dehydrated when having diarrhea along with electrolyte imbalances.  Patient given exitcare handouts on diarrhea and foods to relieve diarrhea.  Notify clinic staff if symptoms return e.g. diarrhea or worsening brain fog at work tomorrow.  RN Chantel in clinic 8a-12p Monday and I return 9am Tuesday.  Patient A&Ox3 spoke full sentences without difficulty no audible cough/congestion/throat clearing/wheezing/shortness of breath. Discussed mask wear at work through 01/25/24 and no eating in employee lunch room through day 10.   Patient verbalized agreement and understanding of treatment plan and had no further questions at this time. P2: Hand washing and fitness   HR notified patient cleared to return onsite 01/22/24 with mask.

## 2024-01-23 NOTE — Telephone Encounter (Signed)
 Patient seen in parking lot.  Stated still fatigued and having some stomach upset.  Tolerating po intake without difficulty.  Noted patient was smoking walking from car to building today.  Spoke full sentences without difficulty A&Ox3 skin warm dry and pink wearing mask.  Discussed continue hydration and bland diet avoid large portions fried/spicy/meat/dairy until stomach back to normal.  Discussed can be symptom of covid or side effect of antiviral.  No cough/congestion/wheezing observed gait sure and steady wearing cloth mask.  Patient agreed with plan of care and had no further questions at this time.

## 2024-01-26 NOTE — Telephone Encounter (Signed)
 Patient seen in workcenter A&Ox3 spoke full sentences without difficulty skin warm dry and pink gait sure and steady.  Denied questions or concerns.  Day 10 of mask wear today has cloth mask on in workcenter.

## 2024-02-02 ENCOUNTER — Other Ambulatory Visit: Payer: Self-pay | Admitting: Family Medicine

## 2024-02-02 DIAGNOSIS — Z2981 Encounter for HIV pre-exposure prophylaxis: Secondary | ICD-10-CM

## 2024-02-04 ENCOUNTER — Encounter: Payer: Self-pay | Admitting: Registered Nurse

## 2024-02-05 ENCOUNTER — Telehealth: Payer: Self-pay | Admitting: Registered Nurse

## 2024-02-05 DIAGNOSIS — F17210 Nicotine dependence, cigarettes, uncomplicated: Secondary | ICD-10-CM

## 2024-02-05 MED ORDER — NICOTINE 14 MG/24HR TD PT24: 14.0000 mg | MEDICATED_PATCH | Freq: Every day | TRANSDERMAL | 0 refills | Status: DC

## 2024-02-05 NOTE — Telephone Encounter (Signed)
Patient wanting to restart nicotine transdermal patches as relapsed with tobacco use.  Rx sent to his pharmacy of choice and he is to come to [facility] clinic for BP check and monthly check ins.   Exitcare video on tips for tobacco cessation and nicotine patches information sent to patient my chart.  Electronic Rx to patient pharmacy of choice nicotine transdermal topical daily apply 1 patch and remove old patch #28 RF0 per epocrates to start at 14mg   x 6 weeks if 6-10 cigarettes per day habit then decrease to 7mg  patch daily x 2 weeks and stop cigarette use at therapy onset. Message left for patient.   Patient agreed with plan of care and had no further questions at that time.

## 2024-02-24 ENCOUNTER — Encounter: Payer: Self-pay | Admitting: Registered Nurse

## 2024-02-24 ENCOUNTER — Telehealth: Payer: Self-pay | Admitting: Registered Nurse

## 2024-02-24 DIAGNOSIS — Z0000 Encounter for general adult medical examination without abnormal findings: Secondary | ICD-10-CM

## 2024-02-24 DIAGNOSIS — F17210 Nicotine dependence, cigarettes, uncomplicated: Secondary | ICD-10-CM

## 2024-02-24 NOTE — Telephone Encounter (Signed)
 Noted patient smoking in parking lot 02/22/2024.  Be Well 2026 due May 2025 fasting executive panel and Hgba1c and tobacco attestation to be signed if any tobacco use in the previous 12 months.  Patient has transdermal nicotine patches Rx to aid in cessation.  Will need to complete tobacco cessation video and quiz if desires insurance discount starting 18 Sep 2024 for employer.  Fasting executive panel and A1c ordered for patient today.  Message left for patient to schedule labs, use video link to watch smoking cessation video in entirety and complete tobacco cessation quiz and return to clinic staff prior to 17 Jun 2024.

## 2024-02-27 ENCOUNTER — Telehealth: Payer: Self-pay | Admitting: Registered Nurse

## 2024-02-27 ENCOUNTER — Encounter: Payer: Self-pay | Admitting: Registered Nurse

## 2024-02-27 DIAGNOSIS — F17210 Nicotine dependence, cigarettes, uncomplicated: Secondary | ICD-10-CM

## 2024-02-27 DIAGNOSIS — Z0000 Encounter for general adult medical examination without abnormal findings: Secondary | ICD-10-CM

## 2024-02-27 NOTE — Telephone Encounter (Signed)
 Patient scheduled Be Well lab draw for 02/29/2024  Executive panel and Hgba1c ordered for patient.  Will need to watch tobacco cessation video as has been smoking in the past year observed last week at work.

## 2024-02-29 ENCOUNTER — Other Ambulatory Visit: Payer: Self-pay

## 2024-02-29 DIAGNOSIS — Z0000 Encounter for general adult medical examination without abnormal findings: Secondary | ICD-10-CM

## 2024-03-01 LAB — HGB A1C W/O EAG: Hgb A1c MFr Bld: 5.3 % (ref 4.8–5.6)

## 2024-03-01 LAB — CMP12+LP+TP+TSH+6AC+CBC/D/PLT
ALT: 22 IU/L (ref 0–44)
AST: 21 IU/L (ref 0–40)
Albumin: 4.9 g/dL (ref 4.1–5.1)
Alkaline Phosphatase: 77 IU/L (ref 44–121)
BUN/Creatinine Ratio: 12 (ref 9–20)
BUN: 10 mg/dL (ref 6–20)
Basophils Absolute: 0 10*3/uL (ref 0.0–0.2)
Basos: 0 %
Bilirubin Total: 0.5 mg/dL (ref 0.0–1.2)
Calcium: 9.9 mg/dL (ref 8.7–10.2)
Chloride: 103 mmol/L (ref 96–106)
Chol/HDL Ratio: 5 ratio (ref 0.0–5.0)
Cholesterol, Total: 181 mg/dL (ref 100–199)
Creatinine, Ser: 0.86 mg/dL (ref 0.76–1.27)
EOS (ABSOLUTE): 0.2 10*3/uL (ref 0.0–0.4)
Eos: 2 %
Estimated CHD Risk: 1 times avg. (ref 0.0–1.0)
Free Thyroxine Index: 1.7 (ref 1.2–4.9)
GGT: 11 IU/L (ref 0–65)
Globulin, Total: 2.1 g/dL (ref 1.5–4.5)
Glucose: 88 mg/dL (ref 70–99)
HDL: 36 mg/dL — ABNORMAL LOW (ref >39)
Hematocrit: 45.6 % (ref 37.5–51.0)
Hemoglobin: 15.3 g/dL (ref 13.0–17.7)
Immature Grans (Abs): 0 10*3/uL (ref 0.0–0.1)
Immature Granulocytes: 0 %
Iron: 121 ug/dL (ref 38–169)
LDH: 196 IU/L (ref 121–224)
LDL Chol Calc (NIH): 136 mg/dL — ABNORMAL HIGH (ref 0–99)
Lymphocytes Absolute: 3.4 10*3/uL — ABNORMAL HIGH (ref 0.7–3.1)
Lymphs: 35 %
MCH: 31.9 pg (ref 26.6–33.0)
MCHC: 33.6 g/dL (ref 31.5–35.7)
MCV: 95 fL (ref 79–97)
Monocytes Absolute: 0.6 10*3/uL (ref 0.1–0.9)
Monocytes: 6 %
Neutrophils Absolute: 5.4 10*3/uL (ref 1.4–7.0)
Neutrophils: 57 %
Phosphorus: 3.6 mg/dL (ref 2.8–4.1)
Platelets: 284 10*3/uL (ref 150–450)
Potassium: 4.4 mmol/L (ref 3.5–5.2)
RBC: 4.79 x10E6/uL (ref 4.14–5.80)
RDW: 12.3 % (ref 11.6–15.4)
Sodium: 141 mmol/L (ref 134–144)
T3 Uptake Ratio: 22 % — ABNORMAL LOW (ref 24–39)
T4, Total: 7.9 ug/dL (ref 4.5–12.0)
TSH: 0.54 u[IU]/mL (ref 0.450–4.500)
Total Protein: 7 g/dL (ref 6.0–8.5)
Triglycerides: 48 mg/dL (ref 0–149)
Uric Acid: 5.9 mg/dL (ref 3.8–8.4)
VLDL Cholesterol Cal: 9 mg/dL (ref 5–40)
WBC: 9.6 10*3/uL (ref 3.4–10.8)
eGFR: 116 mL/min/{1.73_m2} (ref >59)

## 2024-03-03 ENCOUNTER — Encounter: Payer: Self-pay | Admitting: Registered Nurse

## 2024-03-06 ENCOUNTER — Encounter: Payer: Self-pay | Admitting: Registered Nurse

## 2024-03-06 DIAGNOSIS — F17210 Nicotine dependence, cigarettes, uncomplicated: Secondary | ICD-10-CM

## 2024-03-06 MED ORDER — NICOTINE 21 MG/24HR TD PT24: 21.0000 mg | MEDICATED_PATCH | Freq: Every day | TRANSDERMAL | 0 refills | Status: DC

## 2024-03-07 ENCOUNTER — Ambulatory Visit (INDEPENDENT_AMBULATORY_CARE_PROVIDER_SITE_OTHER): Payer: No Typology Code available for payment source | Admitting: Family Medicine

## 2024-03-07 VITALS — BP 100/52 | HR 75 | Temp 98.0°F | Ht 76.0 in | Wt 204.9 lb

## 2024-03-07 DIAGNOSIS — R6889 Other general symptoms and signs: Secondary | ICD-10-CM

## 2024-03-07 DIAGNOSIS — F17200 Nicotine dependence, unspecified, uncomplicated: Secondary | ICD-10-CM | POA: Diagnosis not present

## 2024-03-07 DIAGNOSIS — Z2981 Encounter for HIV pre-exposure prophylaxis: Secondary | ICD-10-CM | POA: Diagnosis not present

## 2024-03-07 DIAGNOSIS — F419 Anxiety disorder, unspecified: Secondary | ICD-10-CM | POA: Diagnosis not present

## 2024-03-07 MED ORDER — BUPROPION HCL ER (XL) 300 MG PO TB24: 300.0000 mg | ORAL_TABLET | Freq: Every day | ORAL | 1 refills | Status: AC

## 2024-03-07 NOTE — Progress Notes (Signed)
 Established Patient Office Visit  Subjective   Patient ID: Ronnie Hester, male    DOB: 01-25-1986  Age: 38 y.o. MRN: [id number]  Chief Complaint  Patient presents with   Medical Management of Chronic Issues    Pt is here for 6 month follow up on ADHD and prophylaxis. He reports that when he first started the wellbutrin he felt a big difference, but then he felt like the effect wore off and it's not working as much as it initially did. We discussed increasing the dose and he is agreeable.   Pt reports he continues on the pre-exposure prophylaxis. Reports no side effects from the medication.   Current Outpatient Medications  Medication Instructions   buPROPion (WELLBUTRIN XL) 300 mg, Oral, Daily   COVID-19 Antigen Test KIT 1 Device, Nasal, Daily PRN   emtricitabine-tenofovir (TRUVADA) 200-300 MG tablet 1 tablet, Oral, Daily   nicotine (NICODERM CQ - DOSED IN MG/24 HOURS) 21 mg, Transdermal, Daily    Patient Active Problem List   Diagnosis Date Noted   Vitamin D deficiency 06/06/2023   Anxiety 06/06/2023   Encounter for HIV pre-exposure prophylaxis 06/06/2023   Tobacco dependence 04/26/2023   Pneumothorax 01/27/2021      Review of Systems  All other systems reviewed and are negative.     Objective:     BP (!) 100/52   Pulse 75   Temp 98 F (36.7 C) (Oral)   Ht 6\' 4"  (1.93 m)   Wt 204 lb 14.4 oz (92.9 kg)   SpO2 97%   BMI 24.94 kg/m    Physical Exam Vitals reviewed.  Constitutional:      Appearance: Normal appearance. He is well-groomed and normal weight.  Cardiovascular:     Rate and Rhythm: Normal rate and regular rhythm.     Heart sounds: S1 normal and S2 normal. No murmur heard. Pulmonary:     Effort: Pulmonary effort is normal.     Breath sounds: Normal breath sounds and air entry. No rales.  Neurological:     General: No focal deficit present.     Mental Status: He is alert and oriented to person, place, and time.     Gait: Gait is intact.   Psychiatric:        Mood and Affect: Mood and affect normal.      No results found for any visits on 03/07/24.    The ASCVD Risk score (Arnett DK, et al., 2019) failed to calculate for the following reasons:   The 2019 ASCVD risk score is only valid for ages 71 to 30    Assessment & Plan:  Encounter for HIV pre-exposure prophylaxis Assessment & Plan: Doing well on the truvada, needs screening bloodwork today  Orders: -     HIV Antibody (routine testing w rflx); Future  Tobacco dependence Assessment & Plan: Pt doing very well on the wellbutrin, however it is not working as well as previous, will increase to 300 mg daily  Orders: -     buPROPion HCl ER (XL); Take 1 tablet (300 mg total) by mouth daily.  Dispense: 90 tablet; Refill: 1  Anxiety -     buPROPion HCl ER (XL); Take 1 tablet (300 mg total) by mouth daily.  Dispense: 90 tablet; Refill: 1  Cold intolerance -     Testosterone; Future  I spent 30 minutes with the patient today following up on medication and evaluating a new problem. I counseled the patient on fatigue and cold intolerance,  he is very thin and it might be from lack of adipose tissue, last set of labs was WNL, he was interested in checking his testosterone, will place order for him.   Return in about 6 months (around 09/07/2024).    Karie Georges, MD

## 2024-03-07 NOTE — Patient Instructions (Signed)
 Omega 3-6-9 supplements-- 2-4 grams per day to help improve cholesterol.

## 2024-03-11 ENCOUNTER — Ambulatory Visit

## 2024-03-13 NOTE — Assessment & Plan Note (Signed)
 Pt doing very well on the wellbutrin, however it is not working as well as previous, will increase to 300 mg daily

## 2024-03-13 NOTE — Assessment & Plan Note (Signed)
 Doing well on the truvada, needs screening bloodwork today

## 2024-03-14 ENCOUNTER — Other Ambulatory Visit

## 2024-03-20 NOTE — Telephone Encounter (Signed)
 Epic reviewed patient had appt with PCM for wellbutrin for smoking cessation and BP 100/52 02/28/24

## 2024-03-20 NOTE — Telephone Encounter (Signed)
 Nicotine transdermal patch Rx sent to patient pharmacy of choice and he is to follow up with RN Olegario Messier in one month for BP check.  Patient notified verbally and had no further questions at that time.

## 2024-03-20 NOTE — Telephone Encounter (Signed)
 Patient completed tobacco attestation and Be Well 2026 ROI with RN Olegario Messier BP 121/78 LDL 136 Hgba1c 5.3 weight [id number] BMI 25 still awaiting tobacco quiz completion for alternative.  Patient has seen PCM for wellbutrin Rx and getting transdermal nicotine from NP at [facility]  Previous BP 119/72 LDL 114 Hgba1c 5.4 weight [id number].

## 2024-04-24 ENCOUNTER — Other Ambulatory Visit: Payer: Self-pay | Admitting: Registered Nurse

## 2024-04-24 DIAGNOSIS — F17210 Nicotine dependence, cigarettes, uncomplicated: Secondary | ICD-10-CM

## 2024-04-25 ENCOUNTER — Other Ambulatory Visit: Payer: Self-pay | Admitting: Registered Nurse

## 2024-04-25 DIAGNOSIS — Z8639 Personal history of other endocrine, nutritional and metabolic disease: Secondary | ICD-10-CM

## 2024-04-29 ENCOUNTER — Encounter: Payer: Self-pay | Admitting: Registered Nurse

## 2024-04-29 DIAGNOSIS — Z8639 Personal history of other endocrine, nutritional and metabolic disease: Secondary | ICD-10-CM | POA: Insufficient documentation

## 2024-04-29 MED ORDER — VITAMIN D3 50 MCG (2000 UT) PO CAPS: 2000.0000 [IU] | ORAL_CAPSULE | Freq: Every day | ORAL | 3 refills | Status: AC

## 2024-04-29 NOTE — Telephone Encounter (Signed)
 Patient replied to NP and stated he did not need refill vitamin D  50,000 units.  After patient finishes tablets at home he is to start 2000 unit po daily with meal meal D3  New rx sent to his pharmacy of choice.

## 2024-04-29 NOTE — Telephone Encounter (Signed)
 Patient notified NP he does not refill transdermal nicotine  refill at this time.  Rx refill refusal sent to his pharmacy of choice.

## 2024-05-01 ENCOUNTER — Encounter: Payer: Self-pay | Admitting: Registered Nurse

## 2024-05-01 DIAGNOSIS — F17210 Nicotine dependence, cigarettes, uncomplicated: Secondary | ICD-10-CM

## 2024-05-01 NOTE — Telephone Encounter (Signed)
 See results note dated 03/01/24.  Patient still needs to complete tobacco cessation quiz and video to receive insurance discount NLT 18 Jul 2024 for 18 Sep 2024 discount.

## 2024-05-01 NOTE — Telephone Encounter (Signed)
 See tcon dated 02/27/2024

## 2024-05-02 MED ORDER — NICOTINE 14 MG/24HR TD PT24: 14.0000 mg | MEDICATED_PATCH | Freq: Every day | TRANSDERMAL | 0 refills | Status: DC

## 2024-05-22 NOTE — Telephone Encounter (Signed)
 Patient reminded still need to complete tobacco cessation quiz and video for insurance discount prior to 18 Jul 2024  Patient verbalized understanding information and had no further questions at that time.

## 2024-05-31 ENCOUNTER — Other Ambulatory Visit: Payer: Self-pay | Admitting: Registered Nurse

## 2024-05-31 DIAGNOSIS — F17210 Nicotine dependence, cigarettes, uncomplicated: Secondary | ICD-10-CM

## 2024-06-03 ENCOUNTER — Ambulatory Visit: Payer: Self-pay

## 2024-06-03 ENCOUNTER — Other Ambulatory Visit: Payer: Self-pay | Admitting: Nurse Practitioner

## 2024-06-03 ENCOUNTER — Ambulatory Visit
Admission: RE | Admit: 2024-06-03 | Discharge: 2024-06-03 | Disposition: A | Discharge status: Home / Self Care | Source: Ambulatory Visit | Attending: Nurse Practitioner

## 2024-06-03 VITALS — BP 122/82 | HR 87 | Temp 98.4°F

## 2024-06-03 DIAGNOSIS — M7918 Myalgia, other site: Secondary | ICD-10-CM

## 2024-06-03 DIAGNOSIS — M791 Myalgia, unspecified site: Secondary | ICD-10-CM

## 2024-06-03 DIAGNOSIS — M545 Low back pain, unspecified: Secondary | ICD-10-CM

## 2024-06-03 NOTE — Progress Notes (Signed)
 Injured lower back climbing stairs in work. Coughing precipitated it. Pain is at 9 resting, Walking intensifies pain to over 10. Notified supervisor. Notified Tonya and received permission to go to the [facility].

## 2024-06-04 ENCOUNTER — Encounter: Payer: Self-pay | Admitting: Registered Nurse

## 2024-06-04 ENCOUNTER — Telehealth: Payer: Self-pay | Admitting: Registered Nurse

## 2024-06-04 ENCOUNTER — Ambulatory Visit: Payer: Self-pay | Admitting: Registered Nurse

## 2024-06-04 VITALS — Resp 16

## 2024-06-04 DIAGNOSIS — Z0000 Encounter for general adult medical examination without abnormal findings: Secondary | ICD-10-CM

## 2024-06-04 DIAGNOSIS — F17210 Nicotine dependence, cigarettes, uncomplicated: Secondary | ICD-10-CM

## 2024-06-04 DIAGNOSIS — M25552 Pain in left hip: Secondary | ICD-10-CM

## 2024-06-04 DIAGNOSIS — M545 Low back pain, unspecified: Secondary | ICD-10-CM

## 2024-06-04 NOTE — Patient Instructions (Signed)
Low Back Sprain or Strain Rehab Ask your health care provider which exercises are safe for you. Do exercises exactly as told by your health care provider and adjust them as directed. It is normal to feel mild stretching, pulling, tightness, or discomfort as you do these exercises. Stop right away if you feel sudden pain or your pain gets worse. Do not begin these exercises until told by your health care provider. Stretching and range-of-motion exercises These exercises warm up your muscles and joints and improve the movement and flexibility of your back. These exercises also help to relieve pain, numbness, and tingling. Lumbar rotation  Lie on your back on a firm bed or the floor with your knees bent. Straighten your arms out to your sides so each arm forms a 90-degree angle (right angle) with a side of your body. Slowly move (rotate) both of your knees to one side of your body until you feel a stretch in your lower back (lumbar). Try not to let your shoulders lift off the floor. Hold this position for ____30______ seconds. Tense your abdominal muscles and slowly move your knees back to the starting position. Repeat this exercise on the other side of your body. Repeat ____3______ times. Complete this exercise ___2_______ times a day. Single knee to chest  Lie on your back on a firm bed or the floor with both legs straight. Bend one of your knees. Use your hands to move your knee up toward your chest until you feel a gentle stretch in your lower back and buttock. Hold your leg in this position by holding on to the front of your knee. Keep your other leg as straight as possible. Hold this position for ____30______ seconds. Slowly return to the starting position. Repeat with your other leg. Repeat ___3_______ times. Complete this exercise ____2______ times a day. Prone extension on elbows  Lie on your abdomen on a firm bed or the floor (prone position). Prop yourself up on your elbows. Use  your arms to help lift your chest up until you feel a gentle stretch in your abdomen and your lower back. This will place some of your body weight on your elbows. If this is uncomfortable, try stacking pillows under your chest. Your hips should stay down, against the surface that you are lying on. Keep your hip and back muscles relaxed. Hold this position for ____30______ seconds. Slowly relax your upper body and return to the starting position. Repeat ___3_______ times. Complete this exercise ___2_______ times a day. Strengthening exercises These exercises build strength and endurance in your back. Endurance is the ability to use your muscles for a long time, even after they get tired. Pelvic tilt This exercise strengthens the muscles that lie deep in the abdomen. Lie on your back on a firm bed or the floor with your legs extended. Bend your knees so they are pointing toward the ceiling and your feet are flat on the floor. Tighten your lower abdominal muscles to press your lower back against the floor. This motion will tilt your pelvis so your tailbone points up toward the ceiling instead of pointing to your feet or the floor. To help with this exercise, you may place a small towel under your lower back and try to push your back into the towel. Hold this position for ____30______ seconds. Let your muscles relax completely before you repeat this exercise. Repeat _____3_____ times. Complete this exercise ____2______ times a day. Alternating arm and leg raises  Get on your hands  and knees on a firm surface. If you are on a hard floor, you may want to use padding, such as an exercise mat, to cushion your knees. Line up your arms and legs. Your hands should be directly below your shoulders, and your knees should be directly below your hips. Lift your left leg behind you. At the same time, raise your right arm and straighten it in front of you. Do not lift your leg higher than your hip. Do not lift  your arm higher than your shoulder. Keep your abdominal and back muscles tight. Keep your hips facing the ground. Do not arch your back. Keep your balance carefully, and do not hold your breath. Hold this position for ____30______ seconds. Slowly return to the starting position. Repeat with your right leg and your left arm. Repeat ___3_______ times. Complete this exercise _____2_____ times a day. Abdominal set with straight leg raise  Lie on your back on a firm bed or the floor. Bend one of your knees and keep your other leg straight. Tense your abdominal muscles and lift your straight leg up, 4-6 inches (10-15 cm) off the ground. Keep your abdominal muscles tight and hold this position for ______30____ seconds. Do not hold your breath. Do not arch your back. Keep it flat against the ground. Keep your abdominal muscles tense as you slowly lower your leg back to the starting position. Repeat with your other leg. Repeat ____3______ times. Complete this exercise ____2______ times a day. Single leg lower with bent knees Lie on your back on a firm bed or the floor. Tense your abdominal muscles and lift your feet off the floor, one foot at a time, so your knees and hips are bent in 90-degree angles (right angles). Your knees should be over your hips and your lower legs should be parallel to the floor. Keeping your abdominal muscles tense and your knee bent, slowly lower one of your legs so your toe touches the ground. Lift your leg back up to return to the starting position. Do not hold your breath. Do not let your back arch. Keep your back flat against the ground. Repeat with your other leg. Repeat ____3______ times. Complete this exercise _____2_____ times a day. Posture and body mechanics Good posture and healthy body mechanics can help to relieve stress in your body's tissues and joints. Body mechanics refers to the movements and positions of your body while you do your daily activities.  Posture is part of body mechanics. Good posture means: Your spine is in its natural S-curve position (neutral). Your shoulders are pulled back slightly. Your head is not tipped forward (neutral). Follow these guidelines to improve your posture and body mechanics in your everyday activities. Standing  When standing, keep your spine neutral and your feet about hip-width apart. Keep a slight bend in your knees. Your ears, shoulders, and hips should line up. When you do a task in which you stand in one place for a long time, place one foot up on a stable object that is 2-4 inches (5-10 cm) high, such as a footstool. This helps keep your spine neutral. Sitting  When sitting, keep your spine neutral and keep your feet flat on the floor. Use a footrest, if necessary, and keep your thighs parallel to the floor. Avoid rounding your shoulders, and avoid tilting your head forward. When working at a desk or a computer, keep your desk at a height where your hands are slightly lower than your elbows. Slide your  chair under your desk so you are close enough to maintain good posture. When working at a computer, place your monitor at a height where you are looking straight ahead and you do not have to tilt your head forward or downward to look at the screen. Resting When lying down and resting, avoid positions that are most painful for you. If you have pain with activities such as sitting, bending, stooping, or squatting, lie in a position in which your body does not bend very much. For example, avoid curling up on your side with your arms and knees near your chest (fetal position). If you have pain with activities such as standing for a long time or reaching with your arms, lie with your spine in a neutral position and bend your knees slightly. Try the following positions: Lying on your side with a pillow between your knees. Lying on your back with a pillow under your knees. Lifting  When lifting objects, keep  your feet at least shoulder-width apart and tighten your abdominal muscles. Bend your knees and hips and keep your spine neutral. It is important to lift using the strength of your legs, not your back. Do not lock your knees straight out. Always ask for help to lift heavy or awkward objects. This information is not intended to replace advice given to you by your health care provider. Make sure you discuss any questions you have with your health care provider. Document Revised: 04/10/2023 Document Reviewed: 02/22/2021 Elsevier Patient Education  2024 [employer] Lumbar Strain A lumbar strain, which is sometimes called a low-back strain, is a stretch or tear in a muscle or tendons in the lower back (lumbar spine). Tendons are the strong cords of tissue that attach muscle to bone. This type of injury occurs when muscles or tendons are torn or are stretched beyond their limits. Lumbar strains can range from mild to severe. Mild strains may involve stretching a muscle or tendon without tearing it. These may heal in 1-2 weeks. More severe strains involve tearing of muscle fibers or tendons. These will cause more pain and may take 6-8 weeks to heal. What are the causes? This condition may be caused by: Trauma, such as a fall or a hit to the body. Twisting or overstretching the back. This may result from doing activities that take a lot of energy, such as lifting heavy objects. What increases the risk? A lumbar strain is more common in: Athletes. People with obesity. People who do repeated lifting, bending, or other movements that involve their back. What are the signs or symptoms? Symptoms of this condition may include: Sharp or dull pain in the lower back that does not go away. The pain may spread to the buttocks. Stiffness or limited range of motion. Sudden muscle tightening (spasms). How is this diagnosed? This condition may be diagnosed based on: Your symptoms. Your medical history. A  physical exam. Imaging tests, such as: X-rays. MRI. How is this treated? Treatment for this condition may include: Rest. Applying heat and cold to the affected area. Over-the-counter medicines to help with pain and inflammation, such as NSAIDs. Prescription medicine for pain or to relax the muscles. These may be needed for a short time. Physical therapy exercises to improve movement and strength. Follow these instructions at home: Managing pain, stiffness, and swelling     If told, put ice on the injured area during the first 24 hours after your injury. Put ice in a plastic bag. Place a towel between  your skin and the bag. Leave the ice on for 20 minutes, 2-3 times a day. If told, apply heat to the affected area as often as told by your health care provider. Use the heat source that your health care provider recommends, such as a moist heat pack or a heating pad. Place a towel between your skin and the heat source. Leave the heat on for 20-30 minutes. Remove the heat if your skin turns bright red. This is especially important if you are unable to feel pain, heat, or cold. You have a greater risk of getting burned. If your skin turns bright red, remove the ice or heat right away to prevent skin damage. The risk of damage is higher if you cannot feel pain, heat, or cold. Activity Rest and return to your normal activities as told by your health care provider. Ask your health care provider what activities are safe for you. Do exercises as told by your health care provider. General instructions Take over-the-counter and prescription medicines only as told by your health care provider. Ask your health care provider if the medicine prescribed to you: Requires you to avoid driving or using machinery. Can cause constipation. You may need to take these actions to prevent or treat constipation: Drink enough fluid to keep your urine pale yellow. Take over-the-counter or prescription  medicines. Eat foods that are high in fiber, such as beans, whole grains, and fresh fruits and vegetables. Limit foods that are high in fat and processed sugars, such as fried or sweet foods. Do not use any products that contain nicotine or tobacco. These products include cigarettes, chewing tobacco, and vaping devices, such as e-cigarettes. If you need help quitting, ask your health care provider. How is this prevented? To prevent a future low-back injury: Always warm up properly before physical activity or sports. Cool down and stretch after being active. Use correct form when playing sports and lifting heavy objects. Bend your knees before you lift heavy objects. Use good posture when sitting and standing. Stay physically fit and keep a healthy weight. Do at least 150 minutes of moderate intensity exercise each week, such as brisk walking or water aerobics. Do strength exercises at least 2 times each week. Contact a health care provider if: Your back pain does not improve after several weeks of treatment. Your symptoms get worse. You have a fever. Get help right away if: Your back pain is severe. You are unable to stand or walk. You develop pain in your legs. You have weakness in your buttocks or legs. You have trouble controlling when you urinate or when you have a bowel movement. You have frequent, painful, or bloody urination. This information is not intended to replace advice given to you by your health care provider. Make sure you discuss any questions you have with your health care provider. Document Revised: 04/10/2023 Document Reviewed: 06/28/2022 Elsevier Patient Education  2024 [employer].

## 2024-06-04 NOTE — Progress Notes (Signed)
 Established Patient Office Visit  Subjective   Patient ID: Stevenson Windmiller, male    DOB: 02-18-1986  Age: 38 y.o. MRN: [id number]  Chief Complaint  Patient presents with   Back Pain    2 shots I received yesterday helped but having clicking left hip/leg when walking today    37y/o caucasian male established patient here to notify NP he has felt clicking in left hip/low back with walking today.  Pain improved since he had toradol and steroid shock at [facility] yesterday but did not resolve completely.  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  Respiratory:  Negative for cough, shortness of breath and wheezing.   Cardiovascular:  Negative for chest pain.  Gastrointestinal:  Negative for diarrhea, nausea and vomiting.  Genitourinary:  Negative for dysuria.  Musculoskeletal:  Positive for back pain, joint pain and myalgias. Negative for falls and neck pain.  Skin:  Negative for itching and rash.  Neurological:  Negative for dizziness, tingling, tremors, sensory change, speech change, focal weakness, seizures, loss of consciousness and weakness.  Psychiatric/Behavioral:  The patient does not have insomnia.       Objective:     Resp 16    Physical Exam Vitals and nursing note reviewed.  Constitutional:      General: He is awake. He is not in acute distress.    Appearance: Normal appearance. He is well-developed, well-groomed and normal weight. He is not ill-appearing, toxic-appearing or diaphoretic.  HENT:     Head: Normocephalic and atraumatic.     Jaw: There is normal jaw occlusion.     Salivary Glands: Right salivary gland is not diffusely enlarged. Left  salivary gland is not diffusely enlarged.     Right Ear: Hearing and external ear normal.     Left Ear: Hearing and external ear normal.     Nose: Nose normal. No congestion or rhinorrhea.     Mouth/Throat:     Lips: Pink. No lesions.     Mouth: Mucous membranes are moist. No oral lesions or angioedema.     Dentition: No gum lesions.     Tongue: No lesions. Tongue does not deviate from midline.     Palate: No mass and lesions.     Pharynx: Oropharynx is clear. Uvula midline.   Eyes:     General: Lids are normal. Vision grossly intact. Gaze aligned appropriately. No scleral icterus.       Right eye: No discharge.        Left eye: No discharge.     Extraocular Movements: Extraocular movements intact.     Conjunctiva/sclera: Conjunctivae normal.     Pupils: Pupils are equal, round, and reactive to light.   Neck:     Trachea: Trachea and phonation normal.   Cardiovascular:     Rate and Rhythm: Normal rate and regular rhythm.     Pulses:          Radial pulses are 2+ on the right side and 2+ on the left side.  Pulmonary:     Effort: Pulmonary effort is normal. No respiratory distress.     Breath sounds: Normal breath sounds and air entry. No stridor or transmitted upper  airway sounds. No wheezing.     Comments: Spoke full sentences without difficulty; no cough observed in exam room Abdominal:     General: Abdomen is flat.     Palpations: Abdomen is soft.   Musculoskeletal:        General: Tenderness and signs of injury present. No swelling or deformity.     Right hand: Normal strength. Normal capillary refill.     Left hand: Normal strength. Normal capillary refill.     Cervical back: Normal range of motion and neck supple. No swelling, edema, deformity, erythema, signs of trauma, lacerations, rigidity, torticollis, tenderness or crepitus. No pain with movement. Normal range of motion.     Thoracic back: No swelling, edema, deformity, signs of trauma, lacerations, spasms or  tenderness. Normal range of motion.     Lumbar back: Tenderness and bony tenderness present. No swelling, edema, deformity, signs of trauma or lacerations. Decreased range of motion. No scoliosis.       Back:     Right lower leg: No edema.     Left lower leg: No edema.     Comments: Left greater than right SI joint TTP; lumbar muscles tense but no focal spasm palpated or trigger point; gait sure and steady in clinic  Lymphadenopathy:     Head:     Right side of head: No submandibular or preauricular adenopathy.     Left side of head: No submandibular or preauricular adenopathy.     Cervical: No cervical adenopathy.     Right cervical: No superficial cervical adenopathy.    Left cervical: No superficial cervical adenopathy.   Skin:    General: Skin is warm and dry.     Capillary Refill: Capillary refill takes less than 2 seconds.     Coloration: Skin is not ashen, cyanotic, jaundiced, mottled, pale or sallow.     Findings: No abrasion, abscess, acne, bruising, burn, ecchymosis, erythema, signs of injury, laceration, lesion, petechiae, rash or wound.     Nails: There is no clubbing.     Comments: Anterior face/neck/hands visually inspected   Neurological:     General: No focal deficit present.     Mental Status: He is alert and oriented to person, place, and time. Mental status is at baseline.     GCS: GCS eye subscore is 4. GCS verbal subscore is 5. GCS motor subscore is 6.     Cranial Nerves: Cranial nerves 2-12 are intact. No cranial nerve deficit, dysarthria or facial asymmetry.     Sensory: Sensation is intact.     Motor: Motor function is intact. No weakness, tremor, atrophy, abnormal muscle tone or seizure activity.     Coordination: Coordination is intact. Coordination normal.     Gait: Gait is intact. Gait normal.     Comments: In/out of chair without difficulty slow with position changes; gait sure and steady in clinic; bilateral hand grasp equal 5/5  Psychiatric:         Attention and Perception: Attention and perception normal.        Mood and Affect: Mood and affect normal.        Speech: Speech normal.        Behavior: Behavior normal. Behavior is cooperative.        Thought Content: Thought content normal.        Cognition and Memory: Cognition and memory normal.        Judgment: Judgment normal.    Isystoc reviewed saw Latanya Poisson NP  yesterday [facility] [place] Practitioner: Latanya Poisson , FNP-C Location: [place] Treatment: Work Comp Initial Visit Restrictions: Home for remainder of the day on 06/03/2024. Thereafter, light duty starting 06/04/2024: Seated duty only. Allow for Mr. Ratledge to sit, stand, walk as he is able to. Avoid stairs. No lifting, pulling or pushing over 5 lbs. Avoid bending and stooping. Allow for him to take a break every 4 hours to apply topical ice and heat to area, as needed. If unable to accommodate, please take out of work completely Return to clinic in 2 weeks, or sooner if problems. **NO USE OF NSAIDS FOR THE NEXT 48-72 HRS PLEASE**  No results found for any visits on 06/04/24.    The ASCVD Risk score (Arnett DK, et al., 2019) failed to calculate for the following reasons:   The 2019 ASCVD risk score is only valid for ages 53 to 72    Assessment & Plan:   Problem List Items Addressed This Visit   None Visit Diagnoses       Acute hip pain, left    -  Primary     Acute midline low back pain, unspecified whether sciatica present       Relevant Medications   methocarbamol (ROBAXIN) 500 MG tablet     Cigarette nicotine  dependence without complication         Preventative health care         Reviewed [facility] instructions/restrictions with patient.  Exitcare handout on lumbar strain and rehab exercises.  Discussed may take tylenol  1000mg  po q6h prn pain if needed.  Continue ice/heat 15 minutes QID prn pain.  See provider same day for re-evaluation if loss of bowel/bladder control, saddle paresthesias or arm/leg weakness.   Next [facility] appt 17 Jun 2024 at 1530  Patient agreed with plan of care and had no further questions at this time.  Discussed tobacco cessation quiz and video due prior to 18 Jul 2024 for [employer] insurance discount alternative requirements.  Refilled nicotine  transdermal 7mg  apply 1 patchy daily and remove old patch sent to pharmacy of his choice yesterday #30 RF0.  Continue follow up with PCM regarding smoking cessation  Return if symptoms worsen or fail to improve, for next [facility] appt 30 Jun 3:30.    Richardine Chancy, NP

## 2024-06-04 NOTE — Telephone Encounter (Signed)
 DG Lumbar Spine Complete [[id number]] Resulted: 06/03/24 1337  Order Status: Completed Updated: 06/03/24 1340  Narrative:    CLINICAL DATA:  One day history of lower back pain. No known injury.  EXAM: LUMBAR SPINE - COMPLETE 5 VIEW  COMPARISON:  None Available.  FINDINGS: There is no evidence of lumbar spine fracture. Alignment is normal. Intervertebral disc spaces are maintained.  IMPRESSION: No acute fracture or traumatic listhesis.   Electronically Signed   By: Limin  Xu M.D.   On: 06/03/2024 13:37

## 2024-06-05 NOTE — Telephone Encounter (Signed)
 Patient reminded still need to complete tobacco cessation quiz and video for insurance discount prior to 18 Jul 2024  Patient verbalized understanding information and had no further questions at that time.

## 2024-06-05 NOTE — Telephone Encounter (Signed)
 See office note 06/04/24 has follow up appt with [facility] 06/17/24 at 1530

## 2024-06-20 ENCOUNTER — Other Ambulatory Visit: Payer: Self-pay | Admitting: Nurse Practitioner

## 2024-06-20 DIAGNOSIS — W57XXXA Bitten or stung by nonvenomous insect and other nonvenomous arthropods, initial encounter: Secondary | ICD-10-CM

## 2024-06-20 MED ORDER — DOXYCYCLINE HYCLATE 100 MG PO TABS: 200.0000 mg | ORAL_TABLET | Freq: Once | ORAL | 0 refills | Status: AC

## 2024-06-20 NOTE — Progress Notes (Signed)
 Discussed tick bite with patient. He pulled it off his upper pelvis this AM. Unsure of how long it was attached. He has concerns because of history of Lyme disease. No rash visualized. Denies fever, ill feeling, or any other concerns at this time. Discussed prophylaxis due to history and he wants to proceed with this.   Doxycycline  sent to pharmacy. Discussed avoidance of sun exposure for the next few days. Return precautions reviewed. RTC for any concerns.

## 2024-06-21 ENCOUNTER — Other Ambulatory Visit: Payer: Self-pay | Admitting: Family Medicine

## 2024-06-21 DIAGNOSIS — A749 Chlamydial infection, unspecified: Secondary | ICD-10-CM

## 2024-06-27 ENCOUNTER — Encounter: Payer: Self-pay | Admitting: Family Medicine

## 2024-06-27 DIAGNOSIS — Z2981 Encounter for HIV pre-exposure prophylaxis: Secondary | ICD-10-CM

## 2024-06-27 MED ORDER — DOXYCYCLINE HYCLATE 100 MG PO TABS: 200.0000 mg | ORAL_TABLET | Freq: Once | ORAL | 5 refills | Status: AC

## 2024-07-03 ENCOUNTER — Ambulatory Visit: Payer: Self-pay | Admitting: Registered Nurse

## 2024-07-03 DIAGNOSIS — F17200 Nicotine dependence, unspecified, uncomplicated: Secondary | ICD-10-CM

## 2024-07-24 NOTE — Telephone Encounter (Signed)
 Patient reported completed tobacco cessation video and quiz  score 8/10 alternative form updated as complete and [employer] form completed met alternatives for insurance discount starting 18 Sep 2024 given to HR Jen.

## 2024-08-29 ENCOUNTER — Telehealth: Payer: Self-pay | Admitting: Registered Nurse

## 2024-08-29 DIAGNOSIS — A084 Viral intestinal infection, unspecified: Secondary | ICD-10-CM

## 2024-08-30 ENCOUNTER — Ambulatory Visit: Admitting: Family Medicine

## 2024-09-01 ENCOUNTER — Encounter: Payer: Self-pay | Admitting: Registered Nurse

## 2024-09-01 NOTE — Telephone Encounter (Signed)
 Patient reported that GI upset has resolved, denied new symptoms or worsening symptoms to include n/v/d/f/c in the past 24 hours.  Patient cleared to return to work tomorrow.  Patient stated would like to return to work tomorrow.  HR notified cleared to return onsite.  Late entry left msg for patient as called out sick Thursday and CC'd NP on supervisor email.  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  No answer and voicemail full unable to leave voicemail Thursday evening.  Left msg via email Friday Brad I called yesterday from work but your voicemail full  When was your last vomiting or diarrhea?  You need to be resolved of symptoms for 24 hours before returning to work  I will notify supervisor and HR of excused absence dates once I get your information e.g. day stated which days missed workPatient reported Friday that symptoms started Wednesday night just diarrhea and sick feeling on his stomach. It leveled out by Thursday evening. Just drained from it at this point but feel much better than I did. I took today 9/12 off as well just to recover a bit from it all   Arvella I will let your supervisor know excused absence 9/11-12.  Are you scheduled to work tomorrow?  Ensure you drink some soup or electrolyte drinks today.  Stay on bland diet.  Avoid high sugar drinks, tea, spicy or friend foods.  I will put foods to relieve diarrhea handout in your my chart.  If tea/cola colored urine, dizziness to point of passing out then you need to get seen somewhere same day.

## 2024-09-03 NOTE — Telephone Encounter (Signed)
 Patient reported work went well yesterday and today so far.  Symptoms are resolved denied concerns  A&Ox3 respirations even and unlabored RA skin warm dry and pink

## 2024-10-16 ENCOUNTER — Emergency Department (HOSPITAL_COMMUNITY)
Admission: EM | Admit: 2024-10-16 | Discharge: 2024-10-16 | Disposition: A | Discharge status: Home / Self Care | Attending: Emergency Medicine | Admitting: Emergency Medicine

## 2024-10-16 ENCOUNTER — Encounter (HOSPITAL_COMMUNITY): Payer: Self-pay | Admitting: Emergency Medicine

## 2024-10-16 ENCOUNTER — Emergency Department (HOSPITAL_COMMUNITY)

## 2024-10-16 ENCOUNTER — Ambulatory Visit: Payer: Self-pay | Admitting: General Practice

## 2024-10-16 ENCOUNTER — Ambulatory Visit: Payer: Self-pay

## 2024-10-16 ENCOUNTER — Other Ambulatory Visit: Payer: Self-pay

## 2024-10-16 DIAGNOSIS — M25512 Pain in left shoulder: Secondary | ICD-10-CM | POA: Insufficient documentation

## 2024-10-16 DIAGNOSIS — Z87891 Personal history of nicotine dependence: Secondary | ICD-10-CM | POA: Insufficient documentation

## 2024-10-16 DIAGNOSIS — R0789 Other chest pain: Secondary | ICD-10-CM | POA: Insufficient documentation

## 2024-10-16 LAB — COMPREHENSIVE METABOLIC PANEL WITH GFR
ALT: 22 U/L (ref 0–44)
AST: 20 U/L (ref 15–41)
Albumin: 4 g/dL (ref 3.5–5.0)
Alkaline Phosphatase: 53 U/L (ref 38–126)
Anion gap: 16 — ABNORMAL HIGH (ref 5–15)
BUN: 9 mg/dL (ref 6–20)
CO2: 25 mmol/L (ref 22–32)
Calcium: 9.1 mg/dL (ref 8.9–10.3)
Chloride: 97 mmol/L — ABNORMAL LOW (ref 98–111)
Creatinine, Ser: 0.76 mg/dL (ref 0.61–1.24)
GFR, Estimated: >60 mL/min (ref >60)
Glucose, Bld: 89 mg/dL (ref 70–99)
Potassium: 3.7 mmol/L (ref 3.5–5.1)
Sodium: 138 mmol/L (ref 135–145)
Total Bilirubin: <0.2 mg/dL (ref 0.0–1.2)
Total Protein: 6.6 g/dL (ref 6.5–8.1)

## 2024-10-16 LAB — CBC WITH DIFFERENTIAL/PLATELET
Abs Immature Granulocytes: 0.03 K/uL (ref 0.00–0.07)
Basophils Absolute: 0 K/uL (ref 0.0–0.1)
Basophils Relative: 0 %
Eosinophils Absolute: 0.2 K/uL (ref 0.0–0.5)
Eosinophils Relative: 2 %
HCT: 43.1 % (ref 39.0–52.0)
Hemoglobin: 14.3 g/dL (ref 13.0–17.0)
Immature Granulocytes: 0 %
Lymphocytes Relative: 32 %
Lymphs Abs: 2.6 K/uL (ref 0.7–4.0)
MCH: 31.4 pg (ref 26.0–34.0)
MCHC: 33.2 g/dL (ref 30.0–36.0)
MCV: 94.5 fL (ref 80.0–100.0)
Monocytes Absolute: 0.6 K/uL (ref 0.1–1.0)
Monocytes Relative: 8 %
Neutro Abs: 4.5 K/uL (ref 1.7–7.7)
Neutrophils Relative %: 58 %
Platelets: 280 K/uL (ref 150–400)
RBC: 4.56 MIL/uL (ref 4.22–5.81)
RDW: 12.7 % (ref 11.5–15.5)
WBC: 7.9 K/uL (ref 4.0–10.5)
nRBC: 0 % (ref 0.0–0.2)

## 2024-10-16 LAB — C-REACTIVE PROTEIN: CRP: <0.5 mg/dL (ref <1.0)

## 2024-10-16 LAB — SEDIMENTATION RATE: Sed Rate: 1 mm/h (ref 0–16)

## 2024-10-16 LAB — TROPONIN I (HIGH SENSITIVITY)
Troponin I (High Sensitivity): <2 ng/L (ref <18)
Troponin I (High Sensitivity): <2 ng/L (ref <18)

## 2024-10-16 MED ORDER — IOHEXOL 350 MG/ML SOLN
75.0000 mL | Freq: Once | INTRAVENOUS | Status: AC | PRN
  Administered 2024-10-16: 75 mL via INTRAVENOUS

## 2024-10-16 MED ORDER — CYCLOBENZAPRINE HCL 10 MG PO TABS: 10.0000 mg | ORAL_TABLET | Freq: Three times a day (TID) | ORAL | 0 refills | Status: AC | PRN

## 2024-10-16 NOTE — Telephone Encounter (Signed)
 FYI Only or Action Required?: FYI only for provider: ED advised.  Patient was last seen in primary care on 03/07/2024 by Ozell Heron HERO, MD.  Called Nurse Triage reporting Shoulder Pain and Chest Pain.  Symptoms began several days ago.  Interventions attempted: Nothing.  Symptoms are: unchanged.  Triage Disposition: Go to ED Now (or PCP Triage)  Patient/caregiver understands and will follow disposition?: Yes   Copied from CRM #[id number]. Topic: Clinical - Red Word Triage >> Oct 16, 2024  1:14 PM China J wrote: [facility] that prompted transfer to Nurse Triage: Left shoulder pain for the past 3 weeks. Pain moves to the upper part of his chest when he tries to move it at ranges. Reason for Disposition  Taking a deep breath makes pain worse  Answer Assessment - Initial Assessment Questions Advised ED now.  Advised 911 if symptoms worsen.  1. LOCATION: Where does it hurt?       Left arm pain radiating into chest, sharp pain upper chest, between chest and arm pit; worse with breathing. 2. RADIATION: Does the pain go anywhere else? (e.g., into neck, jaw, arms, back)     chest 3. ONSET: When did the chest pain begin? (Minutes, hours or days)      2 weeks ago 4. PATTERN: Does the pain come and go, or has it been constant since it started?  Does it get worse with exertion?      Arm constant feels sore, chest pain comes and goes 5. DURATION: How long does it last (e.g., seconds, minutes, hours)     Off and on today everyday 6. SEVERITY: How bad is the pain?  (e.g., Scale 1-10; mild, moderate, or severe)     5/10 7. CARDIAC RISK FACTORS: Do you have any history of heart problems or risk factors for heart disease? (e.g., angina, prior heart attack; diabetes, high blood pressure, high cholesterol, smoker, or strong family history of heart disease)     no 8. PULMONARY RISK FACTORS: Do you have any history of lung disease?  (e.g., blood clots in lung, asthma, emphysema,  birth control pills)     No; hx spontaneous pneumothorax(right side) 9. CAUSE: What do you think is causing the chest pain?     Unsure, maybe 10. OTHER SYMPTOMS: Do you have any other symptoms? (e.g., dizziness, nausea, vomiting, sweating, fever, difficulty breathing, cough)       Denies sob, difficulty breathing, dizziness, faint, n/v, fever, sweating, cough A night having coughs and waking up sweaty, but not curently Sharp pain noted with breathing; around top of breast, near arm pit  Protocols used: Chest Pain-A-AH

## 2024-10-16 NOTE — Telephone Encounter (Signed)
 Noted- ok to close.

## 2024-10-16 NOTE — Discharge Instructions (Addendum)
 Your Ct and EKG are normal.  Take the medication as directed.  Return if any problems.  See your Physician for recheck

## 2024-10-16 NOTE — ED Triage Notes (Signed)
 Pt arrives via POV. Pt reports intermittent chest pain for the past couple of weeks and pain in his left arm as well. Pt reports movement makes the pain worse. He arrives AxOx4.

## 2024-10-16 NOTE — Progress Notes (Signed)
 Employee presented to the clinic this afternoon, signing up to be seen by RN. Employee Health RN assessed him at his work station. Upon arrival, employee was on the telephone with his provider's office. He reported to RN having Lt shoulder pain for 2-3 weeks and today the pain is going under his Lt arm pit and into his chest. He denies any palpitations, elevated heart rate, diaphoresis, headache, weakness or nausea/vomiting.   Provider's office via phone call, advised going to the ED, this RN agreed. Denied the need to ride via ambulance, employee states he feels okay to drive himself. No acute distress i.e. stable on his feet, no flushed face, no holding of Lt arm, shoulder or chest, no rapid respirations, no shortness of breath noted by visual assessment. Advised the nearest ED to his home address in [place]. No VS taken, sent employee immediately to the [facility] ED. Will follow up as appropriate.  10/17/2024 Addendum: Followed up with employee from yesterday's ED visit. He is back to work today, states feeling okay but still with some Lt shoulder pain moving into his Lt chest area. Disappointed regarding a 9 hour visit in the [facility] ED but glad they didn't find anything wrong. Was provided Flexeril prescription that has not been filled yet.  RN explained Flexeril will cause drowsiness and to heed the warnings on the medication of no driving or using heavy equipment and being aware of surroundings. Will follow up if needed.

## 2024-10-16 NOTE — ED Provider Triage Note (Signed)
 Emergency Medicine Provider Triage Evaluation Note  Ronnie Hester , a 38 y.o. male  was evaluated in triage.  Pt complains of chest pain.  Review of Systems  Positive: Chest pain, left arm aching Negative: Fever, nausea  Physical Exam  BP 133/82   Pulse 75   Temp 99 F (37.2 C) (Oral)   Resp 17   SpO2 100%  Gen:   Awake, no distress   Resp:  Normal effort  MSK:   Moves extremities without difficulty  Other:    Medical Decision Making  Medically screening exam initiated at 2:26 PM.  Appropriate orders placed.  Ronnie Hester was informed that the remainder of the evaluation will be completed by another provider, this initial triage assessment does not replace that evaluation, and the importance of remaining in the ED until their evaluation is complete.  38 yo patient with left shoulder pain during the past week w/o injury. Over the last 1-2 days he reports pain in his left chest without definite SOB and has noticed aching down the left arm when walking.    Ronnie Balls, PA-C 10/16/24 1428

## 2024-10-16 NOTE — ED Provider Notes (Signed)
 [place] EMERGENCY DEPARTMENT AT [facility] Provider Note   CSN: [id number] Arrival date & time: 10/16/24  1357     Patient presents with: Chest Pain   Ronnie Hester is a 38 y.o. male.   Patient complains of pain in the left side of his chest.  Patient reports he has pain in his chest that radiates down his left arm.  Patient reports that he saw the company nurse today and was advised to come to the emergency department.  Patient states that the pain goes down his left arm.  Patient reports the pain seems to be worse with movement.  Patient denies any injury he has not done any heavy lifting he denies any straining.  Patient reports he stopped smoking approximately 2 months ago.  Patient states he has had a previous pneumothorax and had a surgical repair.  Patient denies any shortness of breath.  Patient states he has not had any fever or chills he has not felt sick recently.  Patient denies any abdominal pain.  He has not had any nausea or vomiting or diarrhea.  The history is provided by the patient. No language interpreter was used.  Chest Pain      Prior to Admission medications   Medication Sig Start Date End Date Taking? Authorizing Provider  cyclobenzaprine (FLEXERIL) 10 MG tablet Take 1 tablet (10 mg total) by mouth 3 (three) times daily as needed for muscle spasms. 10/16/24  Yes Flint Sonny POUR, PA-C  buPROPion  (WELLBUTRIN  XL) 300 MG 24 hr tablet Take 1 tablet (300 mg total) by mouth daily. 03/07/24   Ozell Heron HERO, MD  Cholecalciferol  (VITAMIN D3) 50 MCG (2000 UT) capsule Take 1 capsule (2,000 Units total) by mouth daily. 04/29/24   Betancourt, Ellouise LABOR, NP  emtricitabine -tenofovir  (TRUVADA) 200-300 MG tablet TAKE 1 TABLET BY MOUTH EVERY DAY 02/02/24   Ozell Heron HERO, MD  FLOWFLEX COVID-19 AG HOME TEST KIT Place 1 each into the nose daily as needed. 01/15/24   [provider]  methocarbamol (ROBAXIN) 500 MG tablet Take 500 mg by mouth 3 (three)  times daily. 06/03/24   [provider]  nicotine  (NICODERM CQ  - DOSED IN MG/24 HR) 7 mg/24hr patch Place 1 patch (7 mg total) onto the skin daily. And remove old patch 06/01/24   Betancourt, Tina A, NP    Allergies: Aspirin and Tramadol    Review of Systems  Cardiovascular:  Positive for chest pain.  All other systems reviewed and are negative.   Updated Vital Signs BP 130/83 (BP Location: Right Arm)   Pulse (!) 57   Temp 98.4 F (36.9 C) (Oral)   Resp 18   SpO2 100%   Physical Exam Vitals and nursing note reviewed.  Constitutional:      Appearance: He is well-developed.  HENT:     Head: Normocephalic.  Cardiovascular:     Rate and Rhythm: Normal rate and regular rhythm.     Heart sounds: Normal heart sounds.  Pulmonary:     Effort: Pulmonary effort is normal.     Breath sounds: Rhonchi present.     Comments: Patient has rhonchi on lung exam.   Chest:     Chest wall: No deformity or tenderness.  Abdominal:     General: There is no distension.     Palpations: Abdomen is soft.  Musculoskeletal:        General: Normal range of motion.     Cervical back: Normal range of motion.  Skin:    General: Skin is warm.  Neurological:     General: No focal deficit present.     Mental Status: He is alert and oriented to person, place, and time.     (all labs ordered are listed, but only abnormal results are displayed) Labs Reviewed  COMPREHENSIVE METABOLIC PANEL WITH GFR - Abnormal; Notable for the following components:      Result Value   Chloride 97 (*)    Anion gap 16 (*)    All other components within normal limits  CBC WITH DIFFERENTIAL/PLATELET  C-REACTIVE PROTEIN  SEDIMENTATION RATE  TROPONIN I (HIGH SENSITIVITY)  TROPONIN I (HIGH SENSITIVITY)    EKG: EKG Interpretation Date/Time:  Wednesday October 16 2024 14:07:01 EDT Ventricular Rate:  74 PR Interval:  164 QRS Duration:  90 QT Interval:  348 QTC Calculation: 386 R Axis:   80  Text  Interpretation: Normal sinus rhythm Normal ECG When compared with ECG of 10-Aug-2015 09:20, PREVIOUS ECG IS PRESENT Confirmed by Patt Alm DEL [phone]) on 10/16/2024 6:15:39 PM  Radiology: CT Angio Chest PE W and/or Wo Contrast Result Date: 10/16/2024 CLINICAL DATA:  Chest pain EXAM: CT ANGIOGRAPHY CHEST WITH CONTRAST TECHNIQUE: Multidetector CT imaging of the chest was performed using the standard protocol during bolus administration of intravenous contrast. Multiplanar CT image reconstructions and MIPs were obtained to evaluate the vascular anatomy. RADIATION DOSE REDUCTION: This exam was performed according to the departmental dose-optimization program which includes automated exposure control, adjustment of the mA and/or kV according to patient size and/or use of iterative reconstruction technique. CONTRAST:  75mL OMNIPAQUE IOHEXOL 350 MG/ML SOLN COMPARISON:  Chest x-ray 10/16/2024 FINDINGS: Cardiovascular: Satisfactory opacification of the pulmonary arteries to the segmental level. No evidence of pulmonary embolism. Normal heart size. No pericardial effusion. Nonaneurysmal aorta Mediastinum/Nodes: No enlarged mediastinal, hilar, or axillary lymph nodes. Thyroid gland, trachea, and esophagus demonstrate no significant findings. Lungs/Pleura: Lungs are clear. No pleural effusion or pneumothorax. Upper Abdomen: No acute abnormality. Musculoskeletal: No chest wall abnormality. No acute or significant osseous findings. Review of the MIP images confirms the above findings. IMPRESSION: Negative. No CT evidence for acute pulmonary embolus. Clear lung fields. Electronically Signed   By: Luke Bun M.D.   On: 10/16/2024 19:09   DG Chest 1 View Result Date: 10/16/2024 CLINICAL DATA:  Chest pain. EXAM: CHEST  1 VIEW COMPARISON:  Chest radiograph dated 08/10/2015. FINDINGS: The heart size and mediastinal contours are within normal limits. Both lungs are clear. The visualized skeletal structures are unremarkable.  IMPRESSION: No active disease. Electronically Signed   By: Vanetta Chou M.D.   On: 10/16/2024 15:29     Procedures   Medications Ordered in the ED  iohexol (OMNIPAQUE) 350 MG/ML injection 75 mL (75 mLs Intravenous Contrast Given 10/16/24 1901)                                    Medical Decision Making Patient complains of pain in the left side of his chest.  Patient has had a previous pneumothorax.  Patient denies any fever or chills he reports the pain is worse with movement  Amount and/or Complexity of Data Reviewed Labs: ordered.    Details: Labs ordered reviewed and interpreted white blood cell count is normal sed rate and CRP are normal troponin is negative Radiology: ordered and independent interpretation performed. Decision-making details documented in ED Course.    Details: Chest  x-ray shows no acute findings. CT angio chest no acute abnormality ECG/medicine tests: ordered and independent interpretation performed. Decision-making details documented in ED Course.    Details: EKG normal sinus normal EKG  Risk Prescription drug management. Risk Details: Patient's evaluation shows no acute findings.  I discussed patient's symptoms with him.  I will try him on Flexeril.  Patient is advised to follow-up with his primary care physician for recheck he is advised to return to the emergency department if symptoms worsen or change.        Final diagnoses:  Chest wall pain    ED Discharge Orders          Ordered    cyclobenzaprine (FLEXERIL) 10 MG tablet  3 times daily PRN        10/16/24 2001           An After Visit Summary was printed and given to the patient.     Avleen Bordwell K, PA-C 10/16/24 2009    Patt Alm Macho, MD 10/16/24 [phone]

## 2024-10-29 ENCOUNTER — Telehealth: Payer: Self-pay | Admitting: Registered Nurse

## 2024-10-29 ENCOUNTER — Encounter: Payer: Self-pay | Admitting: Registered Nurse

## 2024-10-29 DIAGNOSIS — M7522 Bicipital tendinitis, left shoulder: Secondary | ICD-10-CM

## 2024-10-29 NOTE — Telephone Encounter (Signed)
 Follow up with patient in workcenter still having shoulder pain and unable to sleep on left side due to pain.  Right hand dominant.  Was seen in ER  10/16/24 ruled out cardiac or pulmonary cause of pain.  Proximal biceps tendon TTP today left no crepitus or defect palpated.  Empty Beer can test positive also.  Bilateral AROM shoulders equal  bilateral hand grasp equal 5/5  Discussed with patient trial NSAIDS x [redacted] weeks along with home exercise program.  May use heat or ice whichever feels better discussed ice helps control swelling/pain and heat can bring circulation to area and promote healing.  Next follow up in 2 weeks.  Patient agreed with plan of care and had no further questions at this time.  [employer] handout biceps tendonitis and rehab exercises sent to my chart.

## 2024-11-06 NOTE — Telephone Encounter (Signed)
 Patient reported he did start exercises from [employer] handout in my chart but pain not improving.  Has not tried NSAID.  Discussed again start ibuprofen 800mg  po at least but may take up to every 8 hours x 2 weeks.  Continue exercises and if no improvement to schedule follow up with NP in 2 weeks.  Patient agreed with plan of care and had no further questions at this time.

## 2024-12-11 NOTE — Telephone Encounter (Signed)
 Patient seen in workcenter stated doing okay no further questions or concerns at this time.

## 2025-01-02 ENCOUNTER — Encounter: Payer: Self-pay | Admitting: Family Medicine

## 2025-01-02 DIAGNOSIS — F17200 Nicotine dependence, unspecified, uncomplicated: Secondary | ICD-10-CM

## 2025-01-06 MED ORDER — NICOTINE 21 MG/24HR TD PT24: 21.0000 mg | MEDICATED_PATCH | Freq: Every day | TRANSDERMAL | 0 refills | Status: AC
